# Patient Record
Sex: Male | Born: 1975
Health system: Southern US, Community
[De-identification: ages and names within clinical notes are randomized; demographics above are authoritative.]

## PROBLEM LIST (undated history)

## (undated) DIAGNOSIS — I2510 Atherosclerotic heart disease of native coronary artery without angina pectoris: Secondary | ICD-10-CM

## (undated) DIAGNOSIS — I10 Essential (primary) hypertension: Secondary | ICD-10-CM

## (undated) DIAGNOSIS — F419 Anxiety disorder, unspecified: Secondary | ICD-10-CM

## (undated) HISTORY — DX: Anxiety disorder, unspecified: F41.9

## (undated) HISTORY — PX: NO PAST SURGERIES: SHX2092

---

## 2016-11-15 ENCOUNTER — Encounter (HOSPITAL_COMMUNITY): Payer: Self-pay

## 2016-11-15 ENCOUNTER — Emergency Department (HOSPITAL_COMMUNITY): Payer: Self-pay

## 2016-11-15 ENCOUNTER — Emergency Department (HOSPITAL_COMMUNITY)
Admission: EM | Admit: 2016-11-15 | Discharge: 2016-11-15 | Disposition: A | Discharge status: Home / Self Care | Payer: Self-pay | Attending: Emergency Medicine | Admitting: Emergency Medicine

## 2016-11-15 DIAGNOSIS — R079 Chest pain, unspecified: Secondary | ICD-10-CM

## 2016-11-15 DIAGNOSIS — I159 Secondary hypertension, unspecified: Secondary | ICD-10-CM | POA: Insufficient documentation

## 2016-11-15 DIAGNOSIS — F17200 Nicotine dependence, unspecified, uncomplicated: Secondary | ICD-10-CM | POA: Insufficient documentation

## 2016-11-15 DIAGNOSIS — Z720 Tobacco use: Secondary | ICD-10-CM

## 2016-11-15 HISTORY — DX: Essential (primary) hypertension: I10

## 2016-11-15 LAB — CBC
HEMATOCRIT: 44.9 % (ref 39.0–52.0)
HEMOGLOBIN: 15.5 g/dL (ref 13.0–17.0)
MCH: 32.5 pg (ref 26.0–34.0)
MCHC: 34.5 g/dL (ref 30.0–36.0)
MCV: 94.1 fL (ref 78.0–100.0)
Platelets: 199 10*3/uL (ref 150–400)
RBC: 4.77 MIL/uL (ref 4.22–5.81)
RDW: 13.5 % (ref 11.5–15.5)
WBC: 4.2 10*3/uL (ref 4.0–10.5)

## 2016-11-15 LAB — BASIC METABOLIC PANEL
ANION GAP: 14 (ref 5–15)
BUN: 13 mg/dL (ref 6–20)
CO2: 27 mmol/L (ref 22–32)
Calcium: 9.4 mg/dL (ref 8.9–10.3)
Chloride: 95 mmol/L — ABNORMAL LOW (ref 101–111)
Creatinine, Ser: 0.87 mg/dL (ref 0.61–1.24)
GFR calc Af Amer: >60 mL/min (ref >60)
GLUCOSE: 112 mg/dL — AB (ref 65–99)
POTASSIUM: 3.6 mmol/L (ref 3.5–5.1)
Sodium: 136 mmol/L (ref 135–145)

## 2016-11-15 LAB — I-STAT TROPONIN, ED: Troponin i, poc: 0 ng/mL (ref 0.00–0.08)

## 2016-11-15 MED ORDER — LISINOPRIL 20 MG PO TABS: 20.0000 mg | ORAL_TABLET | Freq: Every day | ORAL | 1 refills | Status: DC

## 2016-11-15 MED FILL — LISINOPRIL 20 MG TABLET: 20 | 30 days supply | Qty: 30 | Fill #0

## 2016-11-15 NOTE — ED Triage Notes (Signed)
Pt reports intermittent, non-radiating episodes of central chest pain described as "pressure" that started yesterday associated with hot flashes. He denies any other symptoms.

## 2016-11-15 NOTE — ED Provider Notes (Signed)
[facility] DEPT Provider Note   CSN: [id number] Arrival date & time: 11/15/16  0746     History   Chief Complaint Chief Complaint  Patient presents with  . Chest Pain    HPI Paul Molina is a 41 y.o. male.  The patient complains of chest discomfort, which started last night after he went to sleep and waxed and waned for several hours.  The pain is a "tight feeling".  He has had this several times before, only at night.  He denies exertional dyspnea, fever, chills, nausea, vomiting, weakness or dizziness.  He has previously been diagnosed with high blood pressure, and hyper cholesterolemia, but stopped taking medications, and stop seeing a doctor.  He works as a Counsellor.  He smokes cigarettes.  There are no other known modifying factors.  HPI  Past Medical History:  Diagnosis Date  . Hypertension     There are no active problems to display for this patient.   History reviewed. No pertinent surgical history.     Home Medications    Prior to Admission medications   Medication Sig Start Date End Date Taking? Authorizing Provider  lisinopril (PRINIVIL,ZESTRIL) 20 MG tablet Take 1 tablet (20 mg total) by mouth daily. 11/15/16   Daleen Bo, MD    Family History No family history on file.  Social History Social History  Substance Use Topics  . Smoking status: Current Every Day Smoker  . Smokeless tobacco: Never Used  . Alcohol use No     Allergies   Patient has no allergy information on record.   Review of Systems Review of Systems  All other systems reviewed and are negative.    Physical Exam Updated Vital Signs BP (!) 187/114 (BP Location: Left Arm)   Pulse 81   Temp 98.1 F (36.7 C) (Oral)   Resp 18   SpO2 97%   Physical Exam  Constitutional: He is oriented to person, place, and time. He appears well-developed and well-nourished. No distress.  HENT:  Head: Normocephalic and atraumatic.  Right Ear: External ear  normal.  Left Ear: External ear normal.  Eyes: Conjunctivae and EOM are normal. Pupils are equal, round, and reactive to light.  Neck: Normal range of motion and phonation normal. Neck supple.  Cardiovascular: Normal rate, regular rhythm and normal heart sounds.   Pulmonary/Chest: Effort normal and breath sounds normal. He exhibits no bony tenderness.  Abdominal: Soft. There is no tenderness.  Musculoskeletal: Normal range of motion.  Neurological: He is alert and oriented to person, place, and time. No cranial nerve deficit or sensory deficit. He exhibits normal muscle tone. Coordination normal.  No dysarthria or aphasia  Skin: Skin is warm, dry and intact.  Psychiatric: He has a normal mood and affect. His behavior is normal. Judgment and thought content normal.  Nursing note and vitals reviewed.    ED Treatments / Results  Labs (all labs ordered are listed, but only abnormal results are displayed) Labs Reviewed  BASIC METABOLIC PANEL - Abnormal; Notable for the following:       Result Value   Chloride 95 (*)    Glucose, Bld 112 (*)    All other components within normal limits  CBC  I-STAT TROPOININ, ED    EKG  EKG Interpretation  Date/Time:  Wednesday November 15 2016 07:55:05 EDT Ventricular Rate:  77 PR Interval:  132 QRS Duration: 90 QT Interval:  382 QTC Calculation: 432 R Axis:   90 Text Interpretation:  Normal  sinus rhythm Rightward axis Left ventricular hypertrophy Abnormal ECG No old tracing to compare Confirmed by [facility]  MD, Hager Compston [phone]) on 11/15/2016 9:23:38 AM       Radiology Dg Chest 2 View  Result Date: 11/15/2016 CLINICAL DATA:  Increasing chest pain and tightness since last night with shortness of breath. No known cardiopulmonary abnormalities. Current smoker. [place] lumen CV EXAM: CHEST  2 VIEW COMPARISON:  None in PACs FINDINGS: The lungs are well-expanded and clear. The heart and pulmonary vascularity are normal. The mediastinum is  normal in width. There is no pleural effusion. The bony thorax is unremarkable. IMPRESSION: There is no active cardiopulmonary disease. Electronically Signed   By: David  Martinique M.D.   On: 11/15/2016 08:24    Procedures Procedures (including critical care time)  Medications Ordered in ED Medications - No data to display   Initial Impression / Assessment and Plan / ED Course  I have reviewed the triage vital signs and the nursing notes.  Pertinent labs & imaging results that were available during my care of the patient were reviewed by me and considered in my medical decision making (see chart for details).     Medications - No data to display  Patient Vitals for the past 24 hrs:  BP Temp Temp src Pulse Resp SpO2  11/15/16 0751 (!) 187/114 98.1 F (36.7 C) Oral 81 18 97 %    9:50 AM Reevaluation with update and discussion. After initial assessment and treatment, an updated evaluation reveals no change in clinical status.  Findings discussed with patient and all questions answered.Daleen Bo L    Final Clinical Impressions(s) / ED Diagnoses   Final diagnoses:  Nonspecific chest pain  Secondary hypertension  Tobacco abuse   Nonspecific nocturnal chest pain, with reassuring evaluation.  Doubt ACS PE or pneumonia.  Likely esophageal reflux, gastritis, disease.  Untreated hypertension and elevated cholesterol.  This is the patient's choice.  Also tobacco abuse is present.  Nursing Notes Reviewed/ Care Coordinated Applicable Imaging Reviewed Interpretation of Laboratory Data incorporated into ED treatment  The patient appears reasonably screened and/or stabilized for discharge and I doubt any other medical condition or other [facility] requiring further screening, evaluation, or treatment in the ED at this time prior to discharge.  Plan: Home Medications-OTC antacid, gastric acid blocker or PPI of choice; Home Treatments-avoid tobacco; return here if the recommended treatment, does  not improve the symptoms; Recommended follow up-PCP of choice ASAP, for ongoing management and prescriptions   New Prescriptions New Prescriptions   LISINOPRIL (PRINIVIL,ZESTRIL) 20 MG TABLET    Take 1 tablet (20 mg total) by mouth daily.     Daleen Bo, MD 11/15/16 [phone]

## 2016-11-15 NOTE — Discharge Instructions (Signed)
Your symptoms are likely related to esophageal reflux.  To treat this there are 3 different modalities.  The easiest is an antacid such as Maalox, or Rolaids, before meals and at bedtime.  You could also take a drug called a gastric acid blocker, such as Zantac or Pepcid twice a day.  A third type of treatment is Prilosec once a day.  Whichever type you choose you should take it for at least 3 weeks.  Your blood pressure is very high and you need to start taking a blood pressure medication today.  This can help avoid problems such as stroke heart attack or kidney failure.  Your tobacco abuse, is also a problem because it is likely the source of your chest discomfort, as it typically tends to increase the amount of acid in our gastrointestinal system.  Try to stop smoking immediately.  It is important to follow-up with a primary care doctor for ongoing management of your symptoms, and prescriptions.  He will likely need to be on blood pressure medications for the rest of your life.  Use the phone number attached to this document, to help find a primary care doctor to see within the next 2 or 3 weeks.

## 2016-11-20 ENCOUNTER — Inpatient Hospital Stay: Payer: Self-pay

## 2017-05-01 ENCOUNTER — Encounter (HOSPITAL_COMMUNITY): Payer: Self-pay | Admitting: Emergency Medicine

## 2017-05-01 ENCOUNTER — Ambulatory Visit (HOSPITAL_COMMUNITY)
Admission: EM | Admit: 2017-05-01 | Discharge: 2017-05-01 | Disposition: A | Discharge status: Home / Self Care | Payer: Self-pay | Attending: Urgent Care | Admitting: Urgent Care

## 2017-05-01 DIAGNOSIS — I10 Essential (primary) hypertension: Secondary | ICD-10-CM

## 2017-05-01 DIAGNOSIS — R1030 Lower abdominal pain, unspecified: Secondary | ICD-10-CM

## 2017-05-01 DIAGNOSIS — R030 Elevated blood-pressure reading, without diagnosis of hypertension: Secondary | ICD-10-CM

## 2017-05-01 DIAGNOSIS — R42 Dizziness and giddiness: Secondary | ICD-10-CM

## 2017-05-01 DIAGNOSIS — I160 Hypertensive urgency: Secondary | ICD-10-CM

## 2017-05-01 DIAGNOSIS — R519 Headache, unspecified: Secondary | ICD-10-CM

## 2017-05-01 DIAGNOSIS — R51 Headache: Secondary | ICD-10-CM

## 2017-05-01 LAB — POCT I-STAT, CHEM 8
BUN: 10 mg/dL (ref 6–20)
CREATININE: 0.9 mg/dL (ref 0.61–1.24)
Calcium, Ion: 1.09 mmol/L — ABNORMAL LOW (ref 1.15–1.40)
Chloride: 101 mmol/L (ref 101–111)
Glucose, Bld: 99 mg/dL (ref 65–99)
HEMATOCRIT: 49 % (ref 39.0–52.0)
Hemoglobin: 16.7 g/dL (ref 13.0–17.0)
Potassium: 4.4 mmol/L (ref 3.5–5.1)
Sodium: 139 mmol/L (ref 135–145)
TCO2: 28 mmol/L (ref 22–32)

## 2017-05-01 LAB — POCT URINALYSIS DIP (DEVICE)
Bilirubin Urine: NEGATIVE
Glucose, UA: NEGATIVE mg/dL
Hgb urine dipstick: NEGATIVE
KETONES UR: NEGATIVE mg/dL
Leukocytes, UA: NEGATIVE
Nitrite: NEGATIVE
PH: 6.5 (ref 5.0–8.0)
PROTEIN: NEGATIVE mg/dL
Specific Gravity, Urine: 1.025 (ref 1.005–1.030)
Urobilinogen, UA: 0.2 mg/dL (ref 0.0–1.0)

## 2017-05-01 MED ORDER — AMLODIPINE BESYLATE 5 MG PO TABS: 5.0000 mg | ORAL_TABLET | Freq: Every day | ORAL | 0 refills | Status: DC

## 2017-05-01 NOTE — ED Triage Notes (Addendum)
PT reports abdominal pain, sweating, nausea, dizziness, and headache that started Sunday night. No fever. PT is hypertensive and has not had his meds in over a month.

## 2017-05-01 NOTE — ED Provider Notes (Signed)
MRN: [id number] DOB: 1976-07-29  Subjective:   Paul Molina is a 41 y.o. male presenting for chief complaint of Dizziness and Abdominal Pain  Reports onset of headache, nausea without vomiting, intermittent dizziness, abdominal pain. Has not tried medications for relief. Denies confusion, weakness, numbness or tingling, chest pain, heart racing, palpitations, hematuria, lower leg swelling. Smokes 3 cigarettes. Hydrates very well. Eats processed, frozen meals.   Paul Molina is not currently taking any medications and has No Known Allergies.  Paul Molina  has a past medical history of Hypertension. Denies past surgical history. Father has a history of diabetes.   Objective:   Vitals: BP (!) 192/110   Pulse 72   Temp 98.6 F (37 C) (Oral)   Resp 16   Ht 6' 1"  (1.854 m)   Wt 171 lb (77.6 kg)   SpO2 100%   BMI 22.56 kg/m   BP Readings from Last 3 Encounters:  05/01/17 (!) 192/110  11/15/16 (!) 173/118   Physical Exam  Constitutional: He is oriented to person, place, and time. He appears well-developed and well-nourished.  HENT:  Mouth/Throat: Oropharynx is clear and moist.  Eyes: Pupils are equal, round, and reactive to light. EOM are normal. No scleral icterus.  Neck: Normal range of motion. Neck supple.  Cardiovascular: Normal rate, regular rhythm and intact distal pulses.  Exam reveals no gallop and no friction rub.   No murmur heard. Pulmonary/Chest: No respiratory distress. He has no wheezes. He has no rales.  Abdominal: Soft. Bowel sounds are normal. He exhibits no distension and no mass. There is no tenderness. There is no guarding.  Musculoskeletal: He exhibits no edema.  Neurological: He is alert and oriented to person, place, and time. He displays normal reflexes. No cranial nerve deficit.  Skin: Skin is warm and dry.  Psychiatric: He has a normal mood and affect.   ED ECG REPORT   Date: 05/01/2017  Rate: 68  Rhythm: normal sinus rhythm  QRS Axis: normal  Intervals:  normal  ST/T Wave abnormalities: normal  Conduction Disutrbances:none  Narrative Interpretation: Ecg from today actually looks better than the previous one. No acute processes.  Old EKG Reviewed: 11/15/2016  I have personally reviewed the EKG tracing and agree with the computerized printout as noted.   Results for orders placed or performed during the hospital encounter of 05/01/17 (from the past 24 hour(s))  POCT urinalysis dip (device)     Status: None   Collection Time: 05/01/17  2:09 PM  Result Value Ref Range   Glucose, UA NEGATIVE NEGATIVE mg/dL   Bilirubin Urine NEGATIVE NEGATIVE   Ketones, ur NEGATIVE NEGATIVE mg/dL   Specific Gravity, Urine 1.025 1.005 - 1.030   Hgb urine dipstick NEGATIVE NEGATIVE   pH 6.5 5.0 - 8.0   Protein, ur NEGATIVE NEGATIVE mg/dL   Urobilinogen, UA 0.2 0.0 - 1.0 mg/dL   Nitrite NEGATIVE NEGATIVE   Leukocytes, UA NEGATIVE NEGATIVE  I-STAT, chem 8     Status: Abnormal   Collection Time: 05/01/17  2:27 PM  Result Value Ref Range   Sodium 139 135 - 145 mmol/L   Potassium 4.4 3.5 - 5.1 mmol/L   Chloride 101 101 - 111 mmol/L   BUN 10 6 - 20 mg/dL   Creatinine, Ser 0.90 0.61 - 1.24 mg/dL   Glucose, Bld 99 65 - 99 mg/dL   Calcium, Ion 1.09 (L) 1.15 - 1.40 mmol/L   TCO2 28 22 - 32 mmol/L   Hemoglobin 16.7 13.0 - 17.0 g/dL  HCT 49.0 39.0 - 52.0 %    Assessment and Plan :   Hypertensive urgency  Dizziness  Lower abdominal pain  Acute nonintractable headache, unspecified headache type  Essential hypertension  Elevated blood pressure reading  Will start patient on amlodipine 9m and explained that his BP cannot be lowered to low to quickly. Counseled on lifestyle modifications and need for PCP. Return-to-clinic and ER precautions discussed, patient verbalized understanding. Otherwise, follow up in 1 week.  MJaynee Eagles PA-C [facility]  05/01/2017  1:48 PM    MJaynee Eagles PA-C 05/01/17 1520

## 2017-09-05 ENCOUNTER — Encounter (HOSPITAL_COMMUNITY): Payer: Self-pay | Admitting: Emergency Medicine

## 2017-09-05 ENCOUNTER — Ambulatory Visit (HOSPITAL_COMMUNITY)
Admission: EM | Admit: 2017-09-05 | Discharge: 2017-09-05 | Disposition: A | Discharge status: Home / Self Care | Payer: 59 | Attending: Family Medicine | Admitting: Family Medicine

## 2017-09-05 DIAGNOSIS — M79602 Pain in left arm: Secondary | ICD-10-CM | POA: Diagnosis not present

## 2017-09-05 DIAGNOSIS — M792 Neuralgia and neuritis, unspecified: Secondary | ICD-10-CM

## 2017-09-05 DIAGNOSIS — I10 Essential (primary) hypertension: Secondary | ICD-10-CM | POA: Diagnosis not present

## 2017-09-05 MED ORDER — PREDNISONE 10 MG (21) PO TBPK: ORAL_TABLET | Freq: Every day | ORAL | 0 refills | Status: DC

## 2017-09-05 MED ORDER — AMLODIPINE BESYLATE 5 MG PO TABS: 5.0000 mg | ORAL_TABLET | Freq: Every day | ORAL | 2 refills | Status: DC

## 2017-09-05 NOTE — ED Triage Notes (Signed)
PT C/O: back pain onset 2 weeks associated w/left arm pain   Pain increases w/activity   BP today is 151/123 -- has not had BP meds x1 month   Works on a computer all day  DENIES: inj/trauma   TAKING MEDS: Ibuprofen   A&O x4... NAD... Ambulatory

## 2017-09-10 NOTE — ED Provider Notes (Signed)
  [facility]   [id number] 09/05/17 Arrival Time: 9741  ASSESSMENT & PLAN:  1. Radicular pain of left upper extremity   2. Essential hypertension    Meds ordered this encounter  Medications  . predniSONE (STERAPRED UNI-PAK 21 TAB) 10 MG (21) TBPK tablet    Sig: Take by mouth daily. Take as directed.    Dispense:  21 tablet    Refill:  0  . amLODipine (NORVASC) 5 MG tablet    Sig: Take 1 tablet (5 mg total) by mouth daily.    Dispense:  30 tablet    Refill:  2   Encouraged him to establish care with a PCP. Trial of prednisone for LUE radicular pain.  Recommend recheck BP within one week. May return here.  Reviewed expectations re: course of current medical issues. Questions answered. Outlined signs and symptoms indicating need for more acute intervention. Patient verbalized understanding. After Visit Summary given.  SUBJECTIVE: History from: patient. Paul Molina is a 42 y.o. male who reports:  Reports pain of his left upper extremity along with occas L neck soreness; intermittent; described as aching and shooting with radiation down his L arm Onset: gradual, 2 weeks ago Injury/trama: no Severity: mild to moderate when present Progression: stable Relieved by: nothing in particular Worsened by: certain movements, occasionally Associated symptoms: none reported Extremity sensation changes or weakness: none Self treatment: ibuprofen with mild help  History of similar: no  Also reports that he has been out of his BP medication for one month. Requests refill. Reports HTN previously controlled with medication.  ROS: As per HPI.   OBJECTIVE:  Vitals:   09/05/17 1328  BP: (!) 151/123  Pulse: 83  Resp: 16  Temp: 98.4 F (36.9 C)  TempSrc: Oral  SpO2: 100%    General appearance: alert; no distress Neck: mild tenderness over L neck muscles; no midline tenderness; FROM Extremities: no cyanosis or edema; symmetrical with no gross deformities; poorly  localized mild tenderness of his L shoulder and upper L arm with no swelling and no bruising; ROM: normal CV: normal extremity capillary refill Skin: warm and dry Neurologic: normal gait; normal symmetric reflexes in all extremities; normal sensation in all extremities Psychological: alert and cooperative; normal mood and affect  No Known Allergies  Past Medical History:  Diagnosis Date  . Hypertension    Social History   Socioeconomic History  . Marital status: Single    Spouse name: Not on file  . Number of children: Not on file  . Years of education: Not on file  . Highest education level: Not on file  Social Needs  . Financial resource strain: Not on file  . Food insecurity - worry: Not on file  . Food insecurity - inability: Not on file  . Transportation needs - medical: Not on file  . Transportation needs - non-medical: Not on file  Occupational History  . Not on file  Tobacco Use  . Smoking status: Current Every Day Smoker    Packs/day: 0.20    Types: Cigarettes  . Smokeless tobacco: Never Used  Substance and Sexual Activity  . Alcohol use: Yes  . Drug use: Not on file  . Sexual activity: Not on file  Other Topics Concern  . Not on file  Social History Narrative  . Not on file        Vanessa Kick, MD 09/10/17 1401

## 2018-09-14 IMAGING — CR DG CHEST 2V
2 series · 2 of 2 positions shown · non-contrast
Comparison: None in PACs

CLINICAL DATA: Increasing chest pain and tightness since last night
with shortness of breath. No known cardiopulmonary abnormalities.
Current smoker. [place] lumen CV

EXAM:
CHEST  2 VIEW

[chest pa]
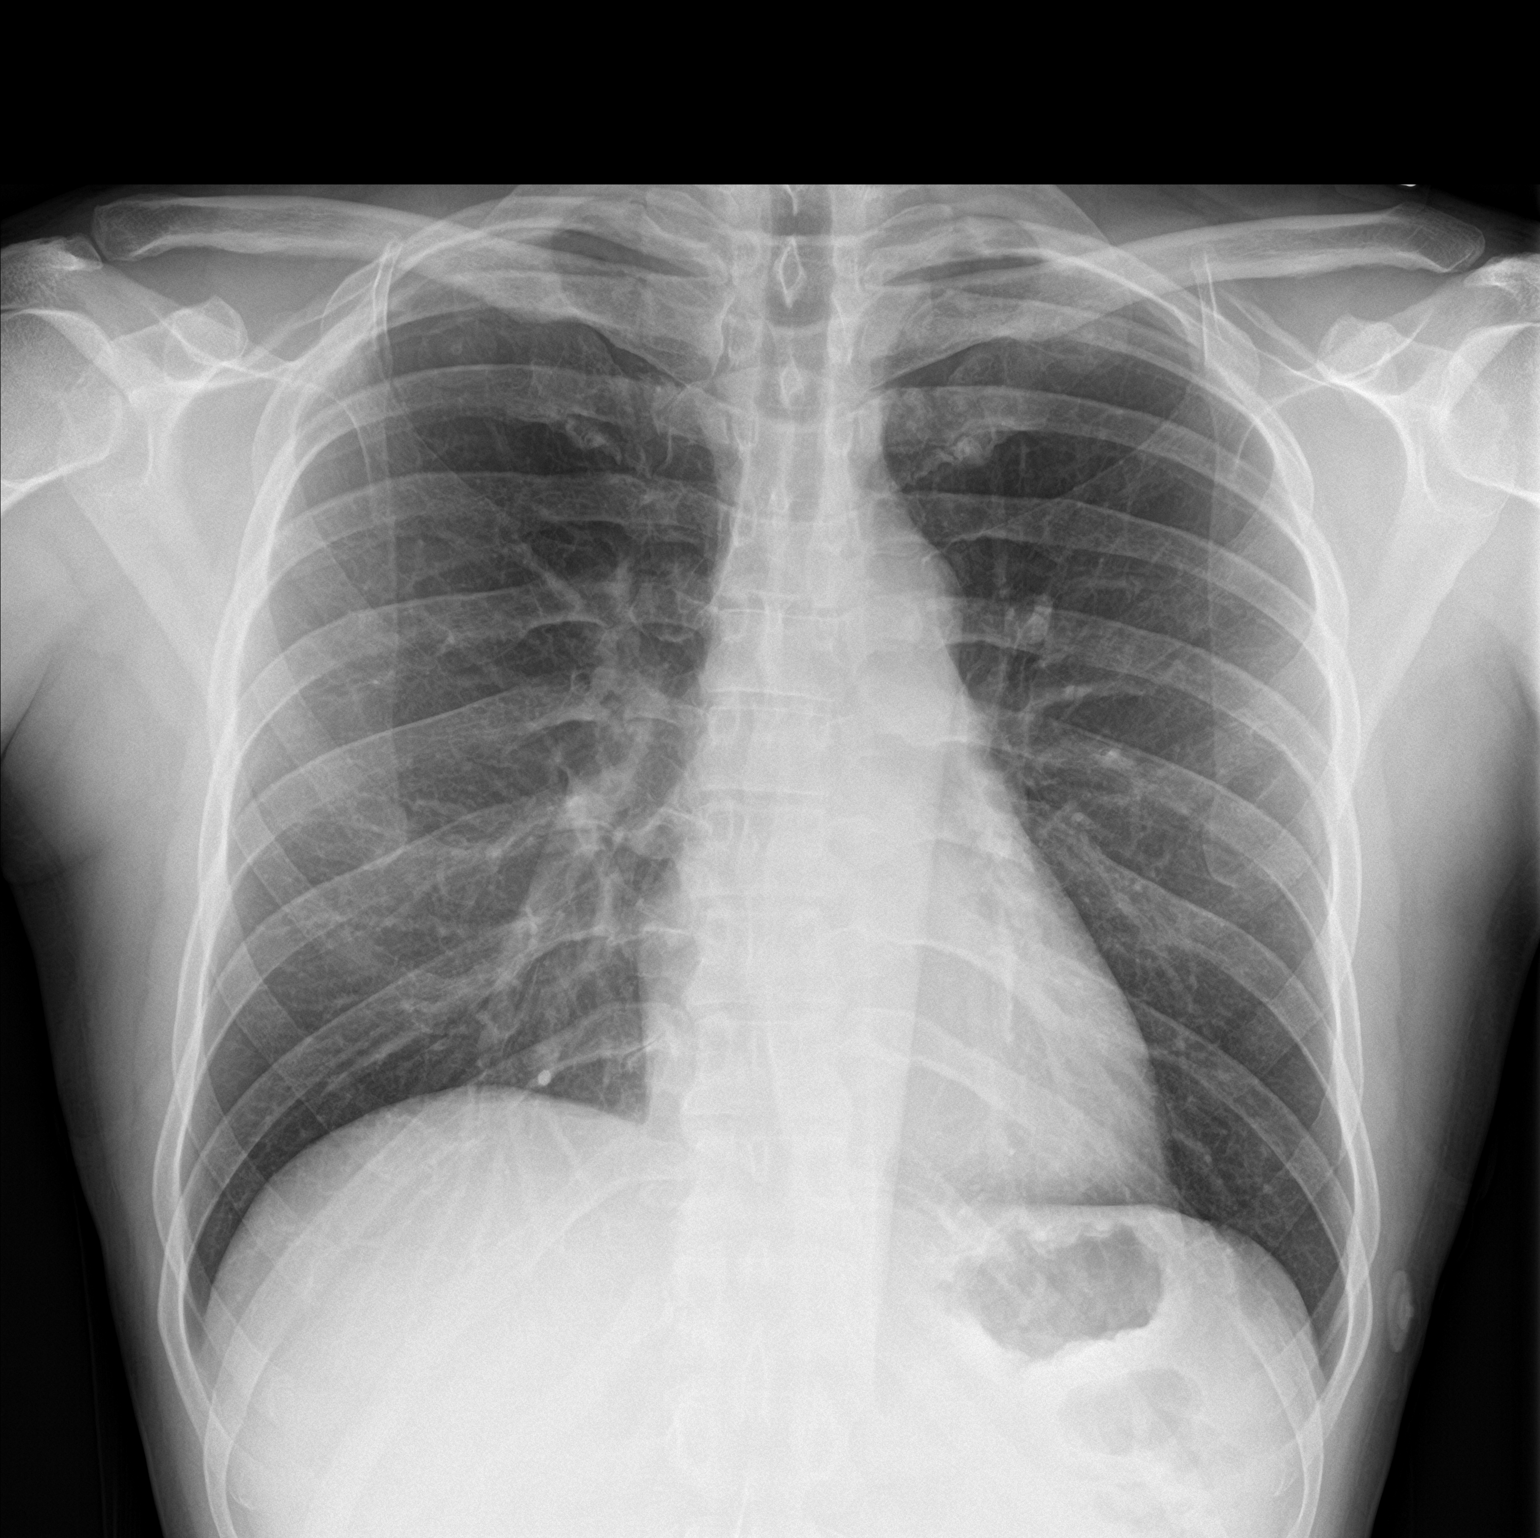

[chest lat]
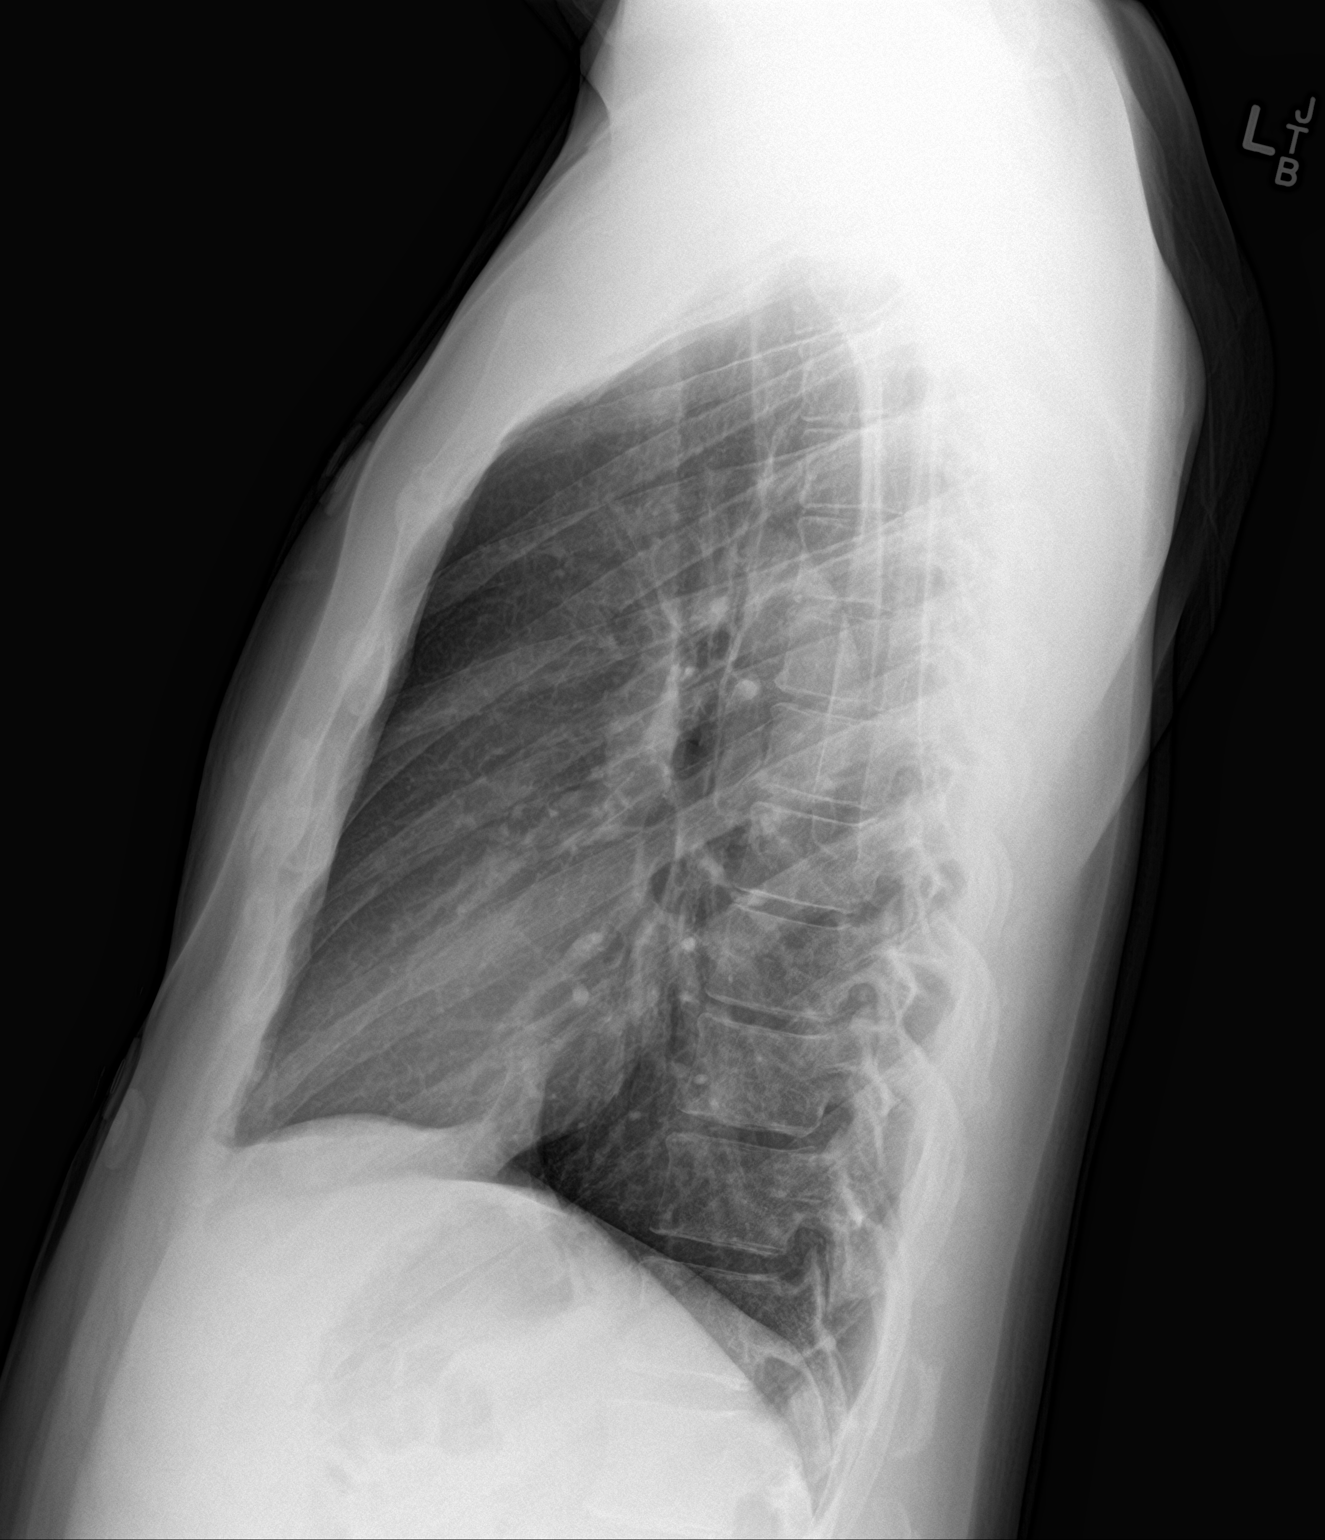

[2 of 2 positions shown; findings below may reference images not displayed]

FINDINGS: The lungs are well-expanded and clear. The heart and pulmonary
vascularity are normal. The mediastinum is normal in width. There is
no pleural effusion. The bony thorax is unremarkable.
IMPRESSION: There is no active cardiopulmonary disease.

## 2019-10-01 ENCOUNTER — Emergency Department (HOSPITAL_COMMUNITY): Payer: Self-pay

## 2019-10-01 ENCOUNTER — Observation Stay (HOSPITAL_COMMUNITY)
Admission: EM | Admit: 2019-10-01 | Discharge: 2019-10-02 | Disposition: A | Discharge status: Home / Self Care | Payer: Self-pay | Attending: Student | Admitting: Student

## 2019-10-01 ENCOUNTER — Other Ambulatory Visit: Payer: Self-pay

## 2019-10-01 ENCOUNTER — Encounter (HOSPITAL_COMMUNITY): Payer: Self-pay | Admitting: Emergency Medicine

## 2019-10-01 DIAGNOSIS — I160 Hypertensive urgency: Secondary | ICD-10-CM

## 2019-10-01 DIAGNOSIS — Z8249 Family history of ischemic heart disease and other diseases of the circulatory system: Secondary | ICD-10-CM | POA: Insufficient documentation

## 2019-10-01 DIAGNOSIS — Z79899 Other long term (current) drug therapy: Secondary | ICD-10-CM | POA: Insufficient documentation

## 2019-10-01 DIAGNOSIS — J984 Other disorders of lung: Secondary | ICD-10-CM | POA: Insufficient documentation

## 2019-10-01 DIAGNOSIS — Z823 Family history of stroke: Secondary | ICD-10-CM | POA: Insufficient documentation

## 2019-10-01 DIAGNOSIS — R0789 Other chest pain: Secondary | ICD-10-CM | POA: Insufficient documentation

## 2019-10-01 DIAGNOSIS — Z9114 Patient's other noncompliance with medication regimen: Secondary | ICD-10-CM | POA: Insufficient documentation

## 2019-10-01 DIAGNOSIS — R931 Abnormal findings on diagnostic imaging of heart and coronary circulation: Secondary | ICD-10-CM | POA: Insufficient documentation

## 2019-10-01 DIAGNOSIS — Z87891 Personal history of nicotine dependence: Secondary | ICD-10-CM | POA: Insufficient documentation

## 2019-10-01 DIAGNOSIS — Z7952 Long term (current) use of systemic steroids: Secondary | ICD-10-CM | POA: Insufficient documentation

## 2019-10-01 DIAGNOSIS — Z720 Tobacco use: Secondary | ICD-10-CM

## 2019-10-01 DIAGNOSIS — I10 Essential (primary) hypertension: Secondary | ICD-10-CM | POA: Insufficient documentation

## 2019-10-01 DIAGNOSIS — R079 Chest pain, unspecified: Secondary | ICD-10-CM

## 2019-10-01 DIAGNOSIS — Z20822 Contact with and (suspected) exposure to covid-19: Secondary | ICD-10-CM | POA: Insufficient documentation

## 2019-10-01 HISTORY — DX: Hypertensive urgency: I16.0

## 2019-10-01 LAB — CBC
HCT: 44.5 % (ref 39.0–52.0)
Hemoglobin: 15.4 g/dL (ref 13.0–17.0)
MCH: 32.8 pg (ref 26.0–34.0)
MCHC: 34.6 g/dL (ref 30.0–36.0)
MCV: 94.7 fL (ref 80.0–100.0)
Platelets: 226 10*3/uL (ref 150–400)
RBC: 4.7 MIL/uL (ref 4.22–5.81)
RDW: 12.6 % (ref 11.5–15.5)
WBC: 3.8 10*3/uL — ABNORMAL LOW (ref 4.0–10.5)
nRBC: 0 % (ref 0.0–0.2)

## 2019-10-01 LAB — BASIC METABOLIC PANEL
Anion gap: 11 (ref 5–15)
BUN: 7 mg/dL (ref 6–20)
CO2: 27 mmol/L (ref 22–32)
Calcium: 9 mg/dL (ref 8.9–10.3)
Chloride: 97 mmol/L — ABNORMAL LOW (ref 98–111)
Creatinine, Ser: 0.9 mg/dL (ref 0.61–1.24)
GFR calc Af Amer: >60 mL/min (ref >60)
GFR calc non Af Amer: >60 mL/min (ref >60)
Glucose, Bld: 115 mg/dL — ABNORMAL HIGH (ref 70–99)
Potassium: 3.7 mmol/L (ref 3.5–5.1)
Sodium: 135 mmol/L (ref 135–145)

## 2019-10-01 LAB — TROPONIN I (HIGH SENSITIVITY)
Troponin I (High Sensitivity): 6 ng/L (ref <18)
Troponin I (High Sensitivity): 5 ng/L (ref <18)

## 2019-10-01 LAB — POC SARS CORONAVIRUS 2 AG -  ED: SARS Coronavirus 2 Ag: NEGATIVE

## 2019-10-01 MED ORDER — HYDRALAZINE HCL 20 MG/ML IJ SOLN
10.0000 mg | Freq: Once | INTRAMUSCULAR | Status: AC
  Administered 2019-10-01: 23:00:00 10 mg via INTRAVENOUS
  Filled 2019-10-01: qty 1

## 2019-10-01 MED ORDER — SODIUM CHLORIDE 0.9% FLUSH: 3.0000 mL | Freq: Once | INTRAVENOUS | Status: DC

## 2019-10-01 MED ORDER — ONDANSETRON HCL 4 MG/2ML IJ SOLN
4.0000 mg | Freq: Once | INTRAMUSCULAR | Status: AC
  Administered 2019-10-01: 4 mg via INTRAVENOUS
  Filled 2019-10-01: qty 2

## 2019-10-01 MED ORDER — MORPHINE SULFATE (PF) 4 MG/ML IV SOLN
4.0000 mg | Freq: Once | INTRAVENOUS | Status: AC
  Administered 2019-10-01: 22:00:00 4 mg via INTRAVENOUS
  Filled 2019-10-01: qty 1

## 2019-10-01 MED ORDER — LABETALOL HCL 5 MG/ML IV SOLN
10.0000 mg | Freq: Once | INTRAVENOUS | Status: AC
  Administered 2019-10-01: 22:00:00 10 mg via INTRAVENOUS
  Filled 2019-10-01: qty 4

## 2019-10-01 NOTE — ED Notes (Signed)
Reported to [place]

## 2019-10-01 NOTE — ED Provider Notes (Signed)
[facility] EMERGENCY DEPARTMENT Provider Note   CSN: [id number] Arrival date & time: 10/01/19  1504     History Chief Complaint  Patient presents with  . Chest Pain  . Dizziness  . Hypertension    Paul Molina is a 44 y.o. male has no history of chest pain, dizziness, hypertension who presents for evaluation of 2 weeks of intermittent chest pain, dizziness.  He states that for the last 2 weeks, he has had intermittent chest pain.  He reports that since yesterday, the chest pain has been constant.  He describes it as "feeling like somebody dropped a pressure plate on his chest."  He does report that it is slightly worse with deep inspiration and worse with exertion.  He has had episodes where he has had diaphoresis, nausea/vomiting associated with the chest pain.  He did have some vomiting that occurred yesterday.  No blood noted in the vomit.  He had an episode of emesis today but since then has not had any more emesis.  He denies any shortness of breath.  He describes the dizziness sensation as a "lightheadedness type sensation."  He attributes this to his blood pressure as he states it has been high over the last several weeks.  He is supposed to be on lisinopril but he states that it does not work and he still has very high blood pressure even on the medication.  He has not taken his lisinopril in about a week.  He states he came in today because he was concerned that the chest pain was lasting for so long.  He has not taken any medications.  He reports that he smoked cigarettes but he states he quit about 2 weeks ago.  He denies any cocaine use.  He has no history of diabetes.  No personal cardiac history.  No family history of MI before the age of 68.  He denies any fevers, cough, abdominal pain.  He does report that several coworkers have tested positive for Covid but he reports limited exposure.  The history is provided by the patient.    HPI: A 44 year old patient  with a history of hypertension presents for evaluation of chest pain. Initial onset of pain was more than 6 hours ago. The patient's chest pain is described as heaviness/pressure/tightness and is worse with exertion. The patient complains of nausea and reports some diaphoresis. The patient's chest pain is not middle- or left-sided, is not well-localized, is not sharp and does not radiate to the arms/jaw/neck. The patient has smoked in the past 90 days. The patient has no history of stroke, has no history of peripheral artery disease, denies any history of treated diabetes, has no relevant family history of coronary artery disease (first degree relative at less than age 47), has no history of hypercholesterolemia and does not have an elevated BMI (>=30).   Past Medical History:  Diagnosis Date  . Hypertension     Patient Active Problem List   Diagnosis Date Noted  . Hypertensive urgency 10/01/2019    History reviewed. No pertinent surgical history.     History reviewed. No pertinent family history.  Social History   Tobacco Use  . Smoking status: Current Every Day Smoker    Packs/day: 0.20    Types: Cigarettes  . Smokeless tobacco: Never Used  Substance Use Topics  . Alcohol use: Yes  . Drug use: Not on file    Home Medications Prior to Admission medications   Medication Sig Start  Date End Date Taking? Authorizing Provider  amLODipine (NORVASC) 5 MG tablet Take 1 tablet (5 mg total) by mouth daily. 09/05/17   Vanessa Kick, MD  predniSONE (STERAPRED UNI-PAK 21 TAB) 10 MG (21) TBPK tablet Take by mouth daily. Take as directed. 09/05/17   Vanessa Kick, MD    Allergies    Patient has no known allergies.  Review of Systems   Review of Systems  Constitutional: Negative for fever.  Respiratory: Negative for cough and shortness of breath.   Cardiovascular: Positive for chest pain.  Gastrointestinal: Negative for abdominal pain, nausea and vomiting.  Genitourinary: Negative for  dysuria and hematuria.  Neurological: Positive for dizziness and headaches.  All other systems reviewed and are negative.   Physical Exam Updated Vital Signs BP (!) 173/106   Pulse 78   Temp 98.4 F (36.9 C) (Oral)   Resp 19   Ht 6' 1"  (1.854 m)   Wt 82.1 kg   SpO2 98%   BMI 23.88 kg/m   Physical Exam Vitals and nursing note reviewed.  Constitutional:      Appearance: Normal appearance. He is well-developed.  HENT:     Head: Normocephalic and atraumatic.  Eyes:     General: Lids are normal.     Conjunctiva/sclera: Conjunctivae normal.     Pupils: Pupils are equal, round, and reactive to light.  Cardiovascular:     Rate and Rhythm: Normal rate and regular rhythm.     Pulses: Normal pulses.          Radial pulses are 2+ on the right side and 2+ on the left side.     Heart sounds: Normal heart sounds. No murmur. No friction rub. No gallop.   Pulmonary:     Effort: Pulmonary effort is normal.     Breath sounds: Normal breath sounds.     Comments: Lungs clear to auscultation bilaterally.  Symmetric chest rise.  No wheezing, rales, rhonchi. Abdominal:     Palpations: Abdomen is soft. Abdomen is not rigid.     Tenderness: There is no abdominal tenderness. There is no guarding.     Comments: Abdomen is soft, non-distended, non-tender. No rigidity, No guarding. No peritoneal signs.  Musculoskeletal:        General: Normal range of motion.     Cervical back: Full passive range of motion without pain.  Skin:    General: Skin is warm and dry.     Capillary Refill: Capillary refill takes less than 2 seconds.  Neurological:     Mental Status: He is alert and oriented to person, place, and time.     Comments: Cranial nerves III-XII intact Follows commands, Moves all extremities  5/5 strength to BUE and BLE  Sensation intact throughout all major nerve distributions No gait abnormalities  No slurred speech. No facial droop.   Psychiatric:        Speech: Speech normal.      ED Results / Procedures / Treatments   Labs (all labs ordered are listed, but only abnormal results are displayed) Labs Reviewed  BASIC METABOLIC PANEL - Abnormal; Notable for the following components:      Result Value   Chloride 97 (*)    Glucose, Bld 115 (*)    All other components within normal limits  CBC - Abnormal; Notable for the following components:   WBC 3.8 (*)    All other components within normal limits  SARS CORONAVIRUS 2 (TAT 6-24 HRS)  POC SARS CORONAVIRUS 2  AG -  ED  TROPONIN I (HIGH SENSITIVITY)  TROPONIN I (HIGH SENSITIVITY)    EKG EKG Interpretation  Date/Time:  Wednesday October 01 2019 16:12:50 EST Ventricular Rate:  68 PR Interval:  136 QRS Duration: 86 QT Interval:  412 QTC Calculation: 438 R Axis:   88 Text Interpretation: Normal sinus rhythm Minimal voltage criteria for LVH, may be normal variant ( Sokolow-Lyon ) Borderline ECG Confirmed by Gerlene Fee [phone]) on 10/01/2019 10:20:53 PM   Radiology DG Chest 2 View  Result Date: 10/01/2019 CLINICAL DATA:  Chest pain EXAM: CHEST - 2 VIEW COMPARISON:  11/15/2016 FINDINGS: The lungs are clear without focal pneumonia, edema, pneumothorax or pleural effusion. Nodular density/densities projecting over the lungs are compatible with pads for telemetry leads. The cardiopericardial silhouette is within normal limits for size. The visualized bony structures of the thorax are intact. IMPRESSION: No active cardiopulmonary disease. Electronically Signed   By: Misty Stanley M.D.   On: 10/01/2019 16:54    Procedures Procedures (including critical care time)  Medications Ordered in ED Medications  sodium chloride flush (NS) 0.9 % injection 3 mL (has no administration in time range)  morphine 4 MG/ML injection 4 mg (4 mg Intravenous Given 10/01/19 2142)  ondansetron (ZOFRAN) injection 4 mg (4 mg Intravenous Given 10/01/19 2143)  labetalol (NORMODYNE) injection 10 mg (10 mg Intravenous Given 10/01/19 2143)   hydrALAZINE (APRESOLINE) injection 10 mg (10 mg Intravenous Given 10/01/19 2253)    ED Course  I have reviewed the triage vital signs and the nursing notes.  Pertinent labs & imaging results that were available during my care of the patient were reviewed by me and considered in my medical decision making (see chart for details).  Clinical Course as of Feb 25 0002  Wed Oct 01, 2019  2035 Troponin I (High Sensitivity): 6 [KP]  2326 RDW: 12.6 [LL]    Clinical Course User Index [KP] Place, Cyril Mourning, Student-PA [LL] Desma Mcgregor   MDM Rules/Calculators/A&P HEAR Score: 58                    44 year old male with past history of hypertension who presents for evaluation of dizziness, chest pain, high blood pressure that has been ongoing for 2 weeks but worsened last 24 hours.  He states he has had intermittent episodes of chest pain that he describes as feeling like a pressure plate is on his chest.  Associated with nausea, vomiting, diaphoresis.  No shortness of breath.  Felt like the chest pain became worse yesterday, prompting ED visit.  Also reports he has had high blood pressure.  He is supposed to be on lisinopril but states he has not been taking the medication in about a week.  He does report that he smokes.  He does not have any personal cardiac history.  No family cardiac history.  No history of diabetes.  On initially arrival, he is afebrile.  He appears uncomfortable but no acute distress.  Blood pressure with systolic blood pressure in the 180s.  No neuro deficits noted on exam.  Concern for ACS etiology versus unstable angina versus hypertensive urgency/emergency.  Plan to check labs.  CBC shows leukocytosis of 3.8.  BMP shows normal BUN and creatinine.  Troponin is 5.  History/risk factors, he has a heart score of 4.  Patient given labetalol and hydralazine and still hypertensive.  At this time, given continued chest pain, high blood pressure, moderate heart score, feel  that he needs admission.  Discussed patient with Dr. Marlowe Sax (hospitalist). She accepts patient for admission.   Portions of this note were generated with Lobby[employer]. Dictation errors may occur despite best attempts at proofreading.  Final Clinical Impression(s) / ED Diagnoses Final diagnoses:  Essential hypertension  Other chest pain    Rx / DC Orders ED Discharge Orders    None       Volanda Napoleon, PA-C 10/02/19 0002    Maudie Flakes, MD 10/07/19 [phone]

## 2019-10-01 NOTE — ED Triage Notes (Signed)
Pt arrives to ED from home with complaints substernal chest pressure x1 week and dizziness x2 days. Patient states his blood pressures at home have been over 217 systolic. No SOB, fevers, chills.

## 2019-10-02 ENCOUNTER — Observation Stay (HOSPITAL_BASED_OUTPATIENT_CLINIC_OR_DEPARTMENT_OTHER): Payer: Self-pay

## 2019-10-02 ENCOUNTER — Encounter (HOSPITAL_COMMUNITY): Payer: Self-pay | Admitting: Internal Medicine

## 2019-10-02 DIAGNOSIS — Z9114 Patient's other noncompliance with medication regimen: Secondary | ICD-10-CM

## 2019-10-02 DIAGNOSIS — Z720 Tobacco use: Secondary | ICD-10-CM

## 2019-10-02 DIAGNOSIS — R079 Chest pain, unspecified: Secondary | ICD-10-CM

## 2019-10-02 DIAGNOSIS — I160 Hypertensive urgency: Secondary | ICD-10-CM

## 2019-10-02 DIAGNOSIS — I517 Cardiomegaly: Secondary | ICD-10-CM

## 2019-10-02 LAB — ECHOCARDIOGRAM COMPLETE
Height: 73 in
Weight: 2718.4 oz

## 2019-10-02 LAB — HIV ANTIBODY (ROUTINE TESTING W REFLEX): HIV Screen 4th Generation wRfx: NONREACTIVE

## 2019-10-02 LAB — SARS CORONAVIRUS 2 (TAT 6-24 HRS): SARS Coronavirus 2: NEGATIVE

## 2019-10-02 MED ORDER — AMLODIPINE BESYLATE 5 MG PO TABS
5.0000 mg | ORAL_TABLET | Freq: Every day | ORAL | Status: DC
  Administered 2019-10-02: 5 mg via ORAL
  Filled 2019-10-02 (×2): qty 1

## 2019-10-02 MED ORDER — AMLODIPINE BESYLATE 10 MG PO TABS: 10.0000 mg | ORAL_TABLET | Freq: Every day | ORAL | 1 refills | Status: DC

## 2019-10-02 MED ORDER — HYDRALAZINE HCL 20 MG/ML IJ SOLN: 10.0000 mg | INTRAMUSCULAR | Status: DC | PRN

## 2019-10-02 MED ORDER — ACETAMINOPHEN 650 MG RE SUPP: 650.0000 mg | Freq: Four times a day (QID) | RECTAL | Status: DC | PRN

## 2019-10-02 MED ORDER — ENOXAPARIN SODIUM 40 MG/0.4ML ~~LOC~~ SOLN
40.0000 mg | SUBCUTANEOUS | Status: DC
  Administered 2019-10-02: 10:00:00 40 mg via SUBCUTANEOUS
  Filled 2019-10-02: qty 0.4

## 2019-10-02 MED ORDER — NICOTINE 7 MG/24HR TD PT24: 7.0000 mg | MEDICATED_PATCH | Freq: Every day | TRANSDERMAL | Status: DC

## 2019-10-02 MED ORDER — CHLORTHALIDONE 25 MG PO TABS
12.5000 mg | ORAL_TABLET | Freq: Every day | ORAL | Status: DC
  Administered 2019-10-02: 03:00:00 12.5 mg via ORAL
  Filled 2019-10-02: qty 0.5

## 2019-10-02 MED ORDER — LABETALOL HCL 5 MG/ML IV SOLN: 10.0000 mg | INTRAVENOUS | Status: DC | PRN

## 2019-10-02 MED ORDER — ACETAMINOPHEN 325 MG PO TABS: 650.0000 mg | ORAL_TABLET | Freq: Four times a day (QID) | ORAL | Status: DC | PRN

## 2019-10-02 MED FILL — AMLODIPINE BESYLATE 10 MG T: 10 | 30 days supply | Qty: 30 | Fill #0

## 2019-10-02 NOTE — Progress Notes (Signed)
Discharge education completed, patient verbalized understanding of education provided and has no additional questions. All education completed, personal belonging given. IV d/c.

## 2019-10-02 NOTE — Progress Notes (Addendum)
New Admission Alert. Patient received to room 606 from [facility] ED via stretcher. ED staff nurse in accompany. No family in accompany. Admission vital signs obtained and entered. Admission history completed with patient serving as historian. Oriented patient to room. Menu provided with instructions on how to call and place food order. Verbalized understanding. Opportunity for questions to be asked and concerns voice. No questions asked. No concerns voiced.   Medication Education: 02.25.21 @ [phone].Vital signs obtained and recorded. Explained to patient there are two as needed are ordered, however the systolic number (top number) need to be 160 or greater. Current systolic number is 903. Verbalized understanding

## 2019-10-02 NOTE — Care Management (Signed)
1006 10-02-19 Case Manager received consult for Primary Care Physician- Patient has Medicaid- Case Manager did make patient aware that he should have a designated PCP on his Medicaid Card. Patient will check once he gets home. Case Manager did schedule a hospital follow up appointment with the [facility]- information placed on the AVS. No further needs from Case Manager at this time.

## 2019-10-02 NOTE — H&P (Addendum)
History and Physical    Ferdinando Lodge RKY:[id number] DOB: 12/05/1975 DOA: 10/01/2019  PCP: Patient, No Pcp Per Patient coming from: Home  Chief Complaint: Chest pain, dizziness, high blood pressure  HPI: Paul Molina is a 44 y.o. male with medical history significant of hypertension presenting to the ED for evaluation of chest pain, dizziness, and high blood pressure.  Patient states he was previously on a very low-dose of lisinopril for high blood pressure but ran out of his medication 2 weeks ago.  He was also on amlodipine at some point in the past.  He currently does not have a primary care physician and the medication was prescribed to him by [facility].  While he took the medication his blood pressure continued to be high.  After running out of medication, his blood pressure has been even higher with systolic in the 315V.  For the past 1 week he has had intermittent nonexertional substernal pressure-like pain associated with diaphoresis and nausea.  He has vomited a few times when he had chest pain and his blood pressure was very high.  Yesterday the chest pain was worse and he checked his blood pressure which was significantly elevated with systolic in the 761Y and diastolic in the 073X.  States his chest pain has now improved after receiving medications in the ED.  Currently not having any headaches, dyspnea, abdominal pain, or other complaints.  ED Course: Blood pressure elevated with systolic above 106 and diastolic up to 269S.  Remainder of vital signs stable.  Renal function at baseline.  High-sensitivity troponin negative x2.  EKG showing LVH and no acute ischemic changes.  Rapid SARS-CoV-2 antigen test negative, PCR test pending.  Chest x-ray showing no active cardiopulmonary disease.  Patient received hydralazine, labetalol, morphine, and Zofran.  Review of Systems:  All systems reviewed and apart from history of presenting illness, are negative.  Past Medical  History:  Diagnosis Date  . Hypertension     History reviewed. No pertinent surgical history.   reports that he has been smoking cigarettes. He has been smoking about 0.20 packs per day. He has never used smokeless tobacco. He reports current alcohol use. No history on file for drug.  No Known Allergies  Family History  Problem Relation Age of Onset  . Hypertension Mother   . Hypertension Father   . Stroke Father     Prior to Admission medications   Medication Sig Start Date End Date Taking? Authorizing Provider  amLODipine (NORVASC) 5 MG tablet Take 1 tablet (5 mg total) by mouth daily. 09/05/17   Vanessa Kick, MD  predniSONE (STERAPRED UNI-PAK 21 TAB) 10 MG (21) TBPK tablet Take by mouth daily. Take as directed. 09/05/17   Vanessa Kick, MD    Physical Exam: Vitals:   10/01/19 2330 10/01/19 2345 10/02/19 0214 10/02/19 0237  BP: (!) 183/115 (!) 173/106 (!) 151/93 (!) (P) 161/106  Pulse: 83 78 85 (P) 82  Resp: 15 19 18  (P) 18  Temp:      TempSrc:      SpO2: 96% 98% 98% (P) 96%  Weight:    (P) 77.1 kg  Height:    (P) 6' 1"  (1.854 m)    Physical Exam  Constitutional: He is oriented to person, place, and time. He appears well-developed and well-nourished. No distress.  HENT:  Head: Normocephalic.  Eyes: Right eye exhibits no discharge. Left eye exhibits no discharge.  Cardiovascular: Normal rate, regular rhythm and intact distal pulses.  Pulmonary/Chest:  Effort normal and breath sounds normal. No respiratory distress. He has no wheezes. He has no rales.  Abdominal: Soft. Bowel sounds are normal. He exhibits no distension. There is no abdominal tenderness. There is no guarding.  Musculoskeletal:        General: No edema.     Cervical back: Neck supple.  Neurological: He is alert and oriented to person, place, and time.  Skin: Skin is warm and dry. He is not diaphoretic.     Labs on Admission: I have personally reviewed following labs and imaging studies  CBC: Recent  Labs  Lab 10/01/19 1610  WBC 3.8*  HGB 15.4  HCT 44.5  MCV 94.7  PLT 132   Basic Metabolic Panel: Recent Labs  Lab 10/01/19 1610  NA 135  K 3.7  CL 97*  CO2 27  GLUCOSE 115*  BUN 7  CREATININE 0.90  CALCIUM 9.0   GFR: Estimated Creatinine Clearance: 118.4 mL/min (by C-G formula based on SCr of 0.9 mg/dL). Liver Function Tests: No results for input(s): AST, ALT, ALKPHOS, BILITOT, PROT, ALBUMIN in the last 168 hours. No results for input(s): LIPASE, AMYLASE in the last 168 hours. No results for input(s): AMMONIA in the last 168 hours. Coagulation Profile: No results for input(s): INR, PROTIME in the last 168 hours. Cardiac Enzymes: No results for input(s): CKTOTAL, CKMB, CKMBINDEX, TROPONINI in the last 168 hours. BNP (last 3 results) No results for input(s): PROBNP in the last 8760 hours. HbA1C: No results for input(s): HGBA1C in the last 72 hours. CBG: No results for input(s): GLUCAP in the last 168 hours. Lipid Profile: No results for input(s): CHOL, HDL, LDLCALC, TRIG, CHOLHDL, LDLDIRECT in the last 72 hours. Thyroid Function Tests: No results for input(s): TSH, T4TOTAL, FREET4, T3FREE, THYROIDAB in the last 72 hours. Anemia Panel: No results for input(s): VITAMINB12, FOLATE, FERRITIN, TIBC, IRON, RETICCTPCT in the last 72 hours. Urine analysis:    Component Value Date/Time   LABSPEC 1.025 05/01/2017 1409   PHURINE 6.5 05/01/2017 1409   GLUCOSEU NEGATIVE 05/01/2017 1409   HGBUR NEGATIVE 05/01/2017 1409   BILIRUBINUR NEGATIVE 05/01/2017 1409   KETONESUR NEGATIVE 05/01/2017 1409   PROTEINUR NEGATIVE 05/01/2017 1409   UROBILINOGEN 0.2 05/01/2017 1409   NITRITE NEGATIVE 05/01/2017 1409   LEUKOCYTESUR NEGATIVE 05/01/2017 1409    Radiological Exams on Admission: DG Chest 2 View  Result Date: 10/01/2019 CLINICAL DATA:  Chest pain EXAM: CHEST - 2 VIEW COMPARISON:  11/15/2016 FINDINGS: The lungs are clear without focal pneumonia, edema, pneumothorax or pleural  effusion. Nodular density/densities projecting over the lungs are compatible with pads for telemetry leads. The cardiopericardial silhouette is within normal limits for size. The visualized bony structures of the thorax are intact. IMPRESSION: No active cardiopulmonary disease. Electronically Signed   By: Misty Stanley M.D.   On: 10/01/2019 16:54    EKG: Independently reviewed.  Sinus rhythm, LVH, no acute ischemic changes.  Assessment/Plan Principal Problem:   Hypertensive urgency Active Problems:   Chest pain   Tobacco use   Hypertensive urgency: Blood pressure improved after receiving hydralazine and labetalol in the ED.  Systolic currently in 440N. Plan: Start amlodipine 5 mg daily and chlorthalidone 12.5 mg daily. Continue hydralazine PRN and labetalol PRN SBP >160.  Order echocardiogram given LVH on EKG.  Chest pain: Appears atypical based on history and suspect is due to hypertensive crisis.  Does have risk factors for CAD including uncontrolled hypertension and tobacco use.  However, work-up currently not suggestive of ACS  given high-sensitivity troponin negative x2 and EKG without acute ischemic changes.  Chest pain improved significantly after improvement in blood pressure.  Currently appears comfortable. Plan: Continue cardiac monitoring.  Order echocardiogram.  Tobacco use: Patient states he stopped smoking 2 weeks ago.  DVT prophylaxis: Lovenox Code Status: Full code Family Communication: No family available at this time. Disposition Plan: Anticipate discharge after clinical improvement. Consults called: None Admission status: It is my clinical opinion that referral for OBSERVATION is reasonable and necessary in this patient based on the above information provided. The aforementioned taken together are felt to place the patient at high risk for further clinical deterioration. However it is anticipated that the patient may be medically stable for discharge from the hospital  within 24 to 48 hours.  The medical decision making on this patient was of high complexity and the patient is at high risk for clinical deterioration, therefore this is a level 3 visit.  Shela Leff MD [facility]  If 7PM-7AM, please contact night-coverage [email or web address]  10/02/2019, 2:42 AM

## 2019-10-02 NOTE — Discharge Summary (Signed)
Physician Discharge Summary  Paul Molina ZOX:[id number] DOB: 1976-07-04 DOA: 10/01/2019  PCP: Patient, No Pcp Per  Admit date: 10/01/2019 Discharge date: 10/02/2019  Admitted From: Home Disposition: Home  Recommendations for Outpatient Follow-up:  1. Follow ups as below. 2. Please obtain CBC/BMP/Mag at follow up 3. Please follow up on the following pending results: None  Home Health: None Equipment/Devices: None  Discharge Condition: Stable CODE STATUS: Full code  Follow-up Information    Gildardo Pounds, NP Follow up on 10/17/2019.   Specialty: Nurse Practitioner Why: @ 3:30 pm for hospital follow up with Geryl Rankins- If you cannot make this scheduled appointment- please call to reschedule.  Contact information: [place] [address] [phone]           Hospital Course: 44 year old male with history of essential hypertension presenting with chest pain, dizzy and high blood pressure in the setting of not taking his antihypertensive medication for 2 weeks.  Reportedly ran out of his medication and has no PCP for refill.  In ED, SBP as high as 205.  DBP as high as 123.  BMP and CBC without significant finding.  High-sensitivity troponin 6> 5.  EKG without acute ischemic finding but mild LVH.  COVID-19 antigen and PCR negative.  HIV nonreactive.  Two view CXR without acute finding.  Patient was admitted for hypertensive urgency and chest pain.  Blood pressure improved with as needed and scheduled antihypertensive medication.  Chest pain and dizziness resolved.  Echocardiogram with EF of 60 to 65%, moderate concentric LVH and G1 DD but no other significant finding.  Patient was discharged on amlodipine 10 mg daily.  Outpatient follow-up as above.  He was counseled on the importance of compliance with his medication.   See individual problem list below for more hospital course.  Discharge Diagnoses:  Hypertensive urgency: Likely due to noncompliance  with his medication.  BP improved. -Discharged on amlodipine 10 mg daily -Counseled on the importance of compliance. -Outpatient follow-up scheduled as above  Atypical chest pain: Could be related to his blood pressure.  Resolved.  High-sensitivity troponin negative x2.  EKG with marked LVH but no acute ischemic finding.  Emphasized the importance of having his blood pressure under good control. HEART score 1.  Abnormal echocardiogram: Moderate concentric LVH with G1 DD.  Likely due to uncontrolled hypertension.  Appears euvolemic. -Management as above  Discharge Instructions  Discharge Instructions    Call MD for:  difficulty breathing, headache or visual disturbances   Complete by: As directed    Call MD for:  persistant dizziness or light-headedness   Complete by: As directed    Call MD for:  severe uncontrolled pain   Complete by: As directed    Diet - low sodium heart healthy   Complete by: As directed    Discharge instructions   Complete by: As directed    It has been a pleasure taking care of you! You were hospitalized with very high blood pressure, chest pain and dizziness likely due to uncontrolled blood pressure from not taking your medication.  We restarted you on amlodipine.  We encourage you to take this medication consistently.  We also recommend quitting smoking cigarettes.  Please follow-up or establish care with a primary care doctor as soon as possible.  Please think about quitting smoking.  This is very important for your health.  You can also call 1-800-QUIT-NOW [phone]) for free smoking cessation counseling.    Take care,   Increase  activity slowly   Complete by: As directed      Allergies as of 10/02/2019   No Known Allergies     Medication List    STOP taking these medications   predniSONE 10 MG (21) Tbpk tablet Commonly known as: STERAPRED UNI-PAK 21 TAB     TAKE these medications   amLODipine 10 MG tablet Commonly known as: NORVASC Take  1 tablet (10 mg total) by mouth daily. What changed:   medication strength  how much to take       Consultations:  None  Procedures/Studies:  2D Echo on 10/02/2019 1. Left ventricular ejection fraction, by estimation, is 60 to 65%. The  left ventricle has normal function. The left ventricle has no regional  wall motion abnormalities. There is moderate concentric left ventricular  hypertrophy. Left ventricular  diastolic parameters are consistent with Grade I diastolic dysfunction  (impaired relaxation).  2. Right ventricular systolic function is normal. The right ventricular  size is normal. Tricuspid regurgitation signal is inadequate for assessing  PA pressure.  3. The mitral valve is normal in structure and function. No evidence of  mitral valve regurgitation. No evidence of mitral stenosis.  4. The aortic valve is normal in structure and function. Aortic valve  regurgitation is not visualized. No aortic stenosis is present.  5. The inferior vena cava is normal in size with <50% respiratory  variability, suggesting right atrial pressure of 8 mmHg.    DG Chest 2 View  Result Date: 10/01/2019 CLINICAL DATA:  Chest pain EXAM: CHEST - 2 VIEW COMPARISON:  11/15/2016 FINDINGS: The lungs are clear without focal pneumonia, edema, pneumothorax or pleural effusion. Nodular density/densities projecting over the lungs are compatible with pads for telemetry leads. The cardiopericardial silhouette is within normal limits for size. The visualized bony structures of the thorax are intact. IMPRESSION: No active cardiopulmonary disease. Electronically Signed   By: Misty Stanley M.D.   On: 10/01/2019 16:54   ECHOCARDIOGRAM COMPLETE  Result Date: 10/02/2019    ECHOCARDIOGRAM REPORT   Patient Name:   Paul Molina Date of Exam: 10/02/2019 Medical Rec #:  [id number]      Height:       73.0 in Accession #:    [id number]     Weight:       169.9 lb Date of Birth:  June 15, 1976      BSA:           2.008 m Patient Age:    44 years       BP:           152/100 mmHg Patient Gender: M              HR:           78 bpm. Exam Location:  Inpatient Procedure: 2D Echo, Color Doppler and Cardiac Doppler Indications:    R07.9* Chest pain, unspecified  History:        Patient has no prior history of Echocardiogram examinations.                 Signs/Symptoms:Chest Pain; Risk Factors:Hypertension.  Sonographer:    Raquel Sarna Senior RDCS Referring Phys: [id number] [place]  1. Left ventricular ejection fraction, by estimation, is 60 to 65%. The left ventricle has normal function. The left ventricle has no regional wall motion abnormalities. There is moderate concentric left ventricular hypertrophy. Left ventricular diastolic parameters are consistent with Grade I diastolic dysfunction (impaired relaxation).  2. Right ventricular systolic function  is normal. The right ventricular size is normal. Tricuspid regurgitation signal is inadequate for assessing PA pressure.  3. The mitral valve is normal in structure and function. No evidence of mitral valve regurgitation. No evidence of mitral stenosis.  4. The aortic valve is normal in structure and function. Aortic valve regurgitation is not visualized. No aortic stenosis is present.  5. The inferior vena cava is normal in size with <50% respiratory variability, suggesting right atrial pressure of 8 mmHg. FINDINGS  Left Ventricle: Left ventricular ejection fraction, by estimation, is 60 to 65%. The left ventricle has normal function. The left ventricle has no regional wall motion abnormalities. The left ventricular internal cavity size was normal in size. There is  moderate concentric left ventricular hypertrophy. Left ventricular diastolic parameters are consistent with Grade I diastolic dysfunction (impaired relaxation). Normal left ventricular filling pressure. Right Ventricle: The right ventricular size is normal. No increase in right ventricular wall thickness.  Right ventricular systolic function is normal. Tricuspid regurgitation signal is inadequate for assessing PA pressure. Left Atrium: Left atrial size was normal in size. Right Atrium: Right atrial size was normal in size. Pericardium: There is no evidence of pericardial effusion. Mitral Valve: The mitral valve is normal in structure and function. Normal mobility of the mitral valve leaflets. No evidence of mitral valve regurgitation. No evidence of mitral valve stenosis. Tricuspid Valve: The tricuspid valve is normal in structure. Tricuspid valve regurgitation is not demonstrated. No evidence of tricuspid stenosis. Aortic Valve: The aortic valve is normal in structure and function. Aortic valve regurgitation is not visualized. No aortic stenosis is present. Pulmonic Valve: The pulmonic valve was normal in structure. Pulmonic valve regurgitation is not visualized. No evidence of pulmonic stenosis. Aorta: The aortic root is normal in size and structure. Venous: The inferior vena cava is normal in size with less than 50% respiratory variability, suggesting right atrial pressure of 8 mmHg. IAS/Shunts: No atrial level shunt detected by color flow Doppler.  LEFT VENTRICLE PLAX 2D LVIDd:         3.90 cm  Diastology LVIDs:         2.10 cm  LV e' lateral:   6.20 cm/s LV PW:         1.40 cm  LV E/e' lateral: 9.3 LV IVS:        1.30 cm  LV e' medial:    7.07 cm/s LVOT diam:     2.00 cm  LV E/e' medial:  8.2 LV SV:         86 LV SV Index:   43 LVOT Area:     3.14 cm  RIGHT VENTRICLE RV S prime:     18.30 cm/s TAPSE (M-mode): 2.4 cm LEFT ATRIUM             Index LA diam:        3.10 cm 1.54 cm/m LA Vol (A2C):   49.4 ml 24.61 ml/m LA Vol (A4C):   37.0 ml 18.43 ml/m LA Biplane Vol: 44.2 ml 22.02 ml/m  AORTIC VALVE LVOT Vmax:   133.00 cm/s LVOT Vmean:  98.700 cm/s LVOT VTI:    0.274 m  AORTA Ao Root diam: 2.60 cm Ao Asc diam:  3.20 cm MITRAL VALVE MV Area (PHT): 3.17 cm    SHUNTS MV Decel Time: 239 msec    Systemic VTI:  0.27  m MV E velocity: 57.70 cm/s  Systemic Diam: 2.00 cm MV A velocity: 66.50 cm/s MV E/A ratio:  0.87 Traci  Turner MD Electronically signed by Fransico Him MD Signature Date/Time: 10/02/2019/9:26:07 AM    Final        Discharge Exam: Vitals:   10/02/19 0809 10/02/19 0942  BP: (!) 155/113 (!) 159/106  Pulse: 74 93  Resp:  19  Temp: 98.2 F (36.8 C)   SpO2: 98% 96%    GENERAL: No acute distress.  Appears well.  HEENT: MMM.  Vision and hearing grossly intact.  NECK: Supple.  No apparent JVD.  RESP:  No IWOB. Good air movement bilaterally. CVS:  RRR. Heart sounds normal.  ABD/GI/GU: Bowel sounds present. Soft. Non tender.  MSK/EXT:  Moves extremities. No apparent deformity or edema.  SKIN: no apparent skin lesion or wound NEURO: Awake, alert and oriented appropriately.  No apparent focal neuro deficit. PSYCH: Calm. Normal affect.   The results of significant diagnostics from this hospitalization (including imaging, microbiology, ancillary and laboratory) are listed below for reference.     Microbiology: Recent Results (from the past 240 hour(s))  SARS CORONAVIRUS 2 (TAT 6-24 HRS) Nasopharyngeal Nasopharyngeal Swab     Status: None   Collection Time: 10/01/19 11:25 PM   Specimen: Nasopharyngeal Swab  Result Value Ref Range Status   SARS Coronavirus 2 NEGATIVE NEGATIVE Final    Comment: (NOTE) SARS-CoV-2 target nucleic acids are NOT DETECTED. The SARS-CoV-2 RNA is generally detectable in upper and lower respiratory specimens during the acute phase of infection. Negative results do not preclude SARS-CoV-2 infection, do not rule out co-infections with other pathogens, and should not be used as the sole basis for treatment or other patient management decisions. Negative results must be combined with clinical observations, patient history, and epidemiological information. The expected result is Negative. Fact Sheet for Patients: SugarRoll.be Fact Sheet  for Healthcare Providers: https://www.woods-mathews.com/ This test is not yet approved or cleared by the [place] FDA and  has been authorized for detection and/or diagnosis of SARS-CoV-2 by FDA under an Emergency Use Authorization (EUA). This EUA will remain  in effect (meaning this test can be used) for the duration of the COVID-19 declaration under Section 56 4(b)(1) of the Act, 21 U.S.C. section 360bbb-3(b)(1), unless the authorization is terminated or revoked sooner. Performed at [facility], [place] [address], [place],  [address]      Labs: BNP (last 3 results) No results for input(s): BNP in the last 8760 hours. Basic Metabolic Panel: Recent Labs  Lab 10/01/19 1610  NA 135  K 3.7  CL 97*  CO2 27  GLUCOSE 115*  BUN 7  CREATININE 0.90  CALCIUM 9.0   Liver Function Tests: No results for input(s): AST, ALT, ALKPHOS, BILITOT, PROT, ALBUMIN in the last 168 hours. No results for input(s): LIPASE, AMYLASE in the last 168 hours. No results for input(s): AMMONIA in the last 168 hours. CBC: Recent Labs  Lab 10/01/19 1610  WBC 3.8*  HGB 15.4  HCT 44.5  MCV 94.7  PLT 226   Cardiac Enzymes: No results for input(s): CKTOTAL, CKMB, CKMBINDEX, TROPONINI in the last 168 hours. BNP: Invalid input(s): POCBNP CBG: No results for input(s): GLUCAP in the last 168 hours. D-Dimer No results for input(s): DDIMER in the last 72 hours. Hgb A1c No results for input(s): HGBA1C in the last 72 hours. Lipid Profile No results for input(s): CHOL, HDL, LDLCALC, TRIG, CHOLHDL, LDLDIRECT in the last 72 hours. Thyroid function studies No results for input(s): TSH, T4TOTAL, T3FREE, THYROIDAB in the last 72 hours.  Invalid input(s): FREET3 Anemia work  up No results for input(s): VITAMINB12, FOLATE, FERRITIN, TIBC, IRON, RETICCTPCT in the last 72 hours. Urinalysis    Component Value Date/Time   LABSPEC 1.025 05/01/2017 1409   PHURINE 6.5 05/01/2017 1409    GLUCOSEU NEGATIVE 05/01/2017 1409   HGBUR NEGATIVE 05/01/2017 1409   BILIRUBINUR NEGATIVE 05/01/2017 1409   KETONESUR NEGATIVE 05/01/2017 1409   PROTEINUR NEGATIVE 05/01/2017 1409   UROBILINOGEN 0.2 05/01/2017 1409   NITRITE NEGATIVE 05/01/2017 1409   LEUKOCYTESUR NEGATIVE 05/01/2017 1409   Sepsis Labs Invalid input(s): PROCALCITONIN,  WBC,  LACTICIDVEN   Time coordinating discharge: 25 minutes  SIGNED:  Mercy Riding, MD  [facility] 10/02/2019, 2:56 PM  If 7PM-7AM, please contact night-coverage [email or web address] Password [email or web address]

## 2019-10-02 NOTE — Progress Notes (Signed)
Echocardiogram 2D Echocardiogram has been performed.  Oneal Deputy Delio Slates 10/02/2019, 9:18 AM

## 2019-10-17 ENCOUNTER — Inpatient Hospital Stay: Payer: Self-pay | Admitting: Nurse Practitioner

## 2019-10-20 ENCOUNTER — Encounter: Payer: Self-pay | Admitting: Nurse Practitioner

## 2019-10-20 ENCOUNTER — Ambulatory Visit: Payer: Self-pay | Attending: Nurse Practitioner | Admitting: Nurse Practitioner

## 2019-10-20 ENCOUNTER — Other Ambulatory Visit: Payer: Self-pay

## 2019-10-20 VITALS — Ht 73.0 in

## 2019-10-20 DIAGNOSIS — Z7689 Persons encountering health services in other specified circumstances: Secondary | ICD-10-CM

## 2019-10-20 DIAGNOSIS — I10 Essential (primary) hypertension: Secondary | ICD-10-CM

## 2019-10-20 NOTE — Progress Notes (Signed)
Virtual Visit via Telephone Note Due to national recommendations of social distancing due to [place] 19, telehealth visit is felt to be most appropriate for this patient at this time.  I discussed the limitations, risks, security and privacy concerns of performing an evaluation and management service by telephone and the availability of in person appointments. I also discussed with the patient that there may be a patient responsible charge related to this service. The patient expressed understanding and agreed to proceed.    I connected with Paul Molina on 10/20/19  at   9:10 AM EDT  EDT by telephone and verified that I am speaking with the correct person using two identifiers.   Consent I discussed the limitations, risks, security and privacy concerns of performing an evaluation and management service by telephone and the availability of in person appointments. I also discussed with the patient that there may be a patient responsible charge related to this service. The patient expressed understanding and agreed to proceed.   Location of Patient: Private Residence   Location of Provider: [place] [facility] [employer] Office    Persons participating in Telemedicine visit: Paul Rankins FNP-BC [place]    History of Present Illness: Telemedicine visit for: Establish Care  Essential Hypertension Was admitted to the hospital due to uncontrolled HTN 10-01-2019 after being out of his blood pressure medication for 2 weeks.  Blood pressure in the ED was 205/123.    There are no electrolyte abnormalities.  EKG without acute ischemic finding but mild LVH.  Chest x-ray within normal limits echocardiogram with EF of 60 to 65%.  Patient was admitted for hypertensive urgency and chest pain.  He was discharged home in stable condition on amlodipine 10 mg which she currently states he is still taking.  Unfortunately he has not purchased a blood pressure device so he has not been  able to check his blood pressure at home.  He states he was diagnosed with HTN over 10 years ago . Endorses intermittent chest pain which he feels is likely related to high stress.  He does have a history of anxiety.  Denies  shortness of breath, palpitations, lightheadedness, dizziness, headaches or BLE edema.  BP Readings from Last 3 Encounters:  10/02/19 (!) 159/106  09/05/17 (!) 151/123  05/01/17 (!) 192/110     Past Medical History:  Diagnosis Date  . Anxiety   . Hypertension     Past Surgical History:  Procedure Laterality Date  . NO PAST SURGERIES      Family History  Problem Relation Age of Onset  . Hypertension Mother   . Hypertension Father   . Stroke Father     Social History   Socioeconomic History  . Marital status: Single    Spouse name: Not on file  . Number of children: Not on file  . Years of education: Not on file  . Highest education level: Not on file  Occupational History  . Not on file  Tobacco Use  . Smoking status: Former Smoker    Packs/day: 0.20    Types: Cigarettes  . Smokeless tobacco: Never Used  Substance and Sexual Activity  . Alcohol use: Yes  . Drug use: Not Currently  . Sexual activity: Yes  Other Topics Concern  . Not on file  Social History Narrative  . Not on file   Social Determinants of Health   Financial Resource Strain:   . Difficulty of Paying Living Expenses:   Food Insecurity:   .  Worried About Charity fundraiser in the Last Year:   . Arboriculturist in the Last Year:   Transportation Needs:   . Film/video editor (Medical):   Marland Kitchen Lack of Transportation (Non-Medical):   Physical Activity:   . Days of Exercise per Week:   . Minutes of Exercise per Session:   Stress:   . Feeling of Stress :   Social Connections:   . Frequency of Communication with Friends and Family:   . Frequency of Social Gatherings with Friends and Family:   . Attends Religious Services:   . Active Member of Clubs or Organizations:   .  Attends Archivist Meetings:   Marland Kitchen Marital Status:      Observations/Objective: Awake, alert and oriented x 3   Review of Systems  Constitutional: Negative for fever, malaise/fatigue and weight loss.  HENT: Negative.  Negative for nosebleeds.   Eyes: Negative.  Negative for blurred vision, double vision and photophobia.  Respiratory: Negative.  Negative for cough and shortness of breath.   Cardiovascular: Negative.  Negative for chest pain, palpitations and leg swelling.  Gastrointestinal: Negative.  Negative for heartburn, nausea and vomiting.  Musculoskeletal: Negative.  Negative for myalgias.  Neurological: Negative.  Negative for dizziness, focal weakness, seizures and headaches.  Psychiatric/Behavioral: Negative for suicidal ideas. The patient is nervous/anxious.     Assessment and Plan: [name] was seen today for hospitalization follow-up.  Diagnoses and all orders for this visit:  Encounter to establish care  Essential hypertension Continue amlodipine 10 mg as prescribed.  Remember to bring in your blood pressure log with you for your follow up appointment.  DASH/Mediterranean Diets are healthier choices for HTN.     Follow Up Instructions Return in about 4 weeks (around 11/17/2019) for BP recheck.     I discussed the assessment and treatment plan with the patient. The patient was provided an opportunity to ask questions and all were answered. The patient agreed with the plan and demonstrated an understanding of the instructions.   The patient was advised to call back or seek an in-person evaluation if the symptoms worsen or if the condition fails to improve as anticipated.  I provided 15 minutes of non-face-to-face time during this encounter including median intraservice time, reviewing previous notes, labs, imaging, medications and explaining diagnosis and management.  Gildardo Pounds, FNP-BC

## 2019-11-18 ENCOUNTER — Ambulatory Visit: Payer: Self-pay | Admitting: Nurse Practitioner

## 2021-07-30 IMAGING — CR DG CHEST 2V
2 series · 2 of 2 positions shown · non-contrast
Comparison: 11/15/2016

CLINICAL DATA: Chest pain

EXAM:
CHEST - 2 VIEW

[chest pa]
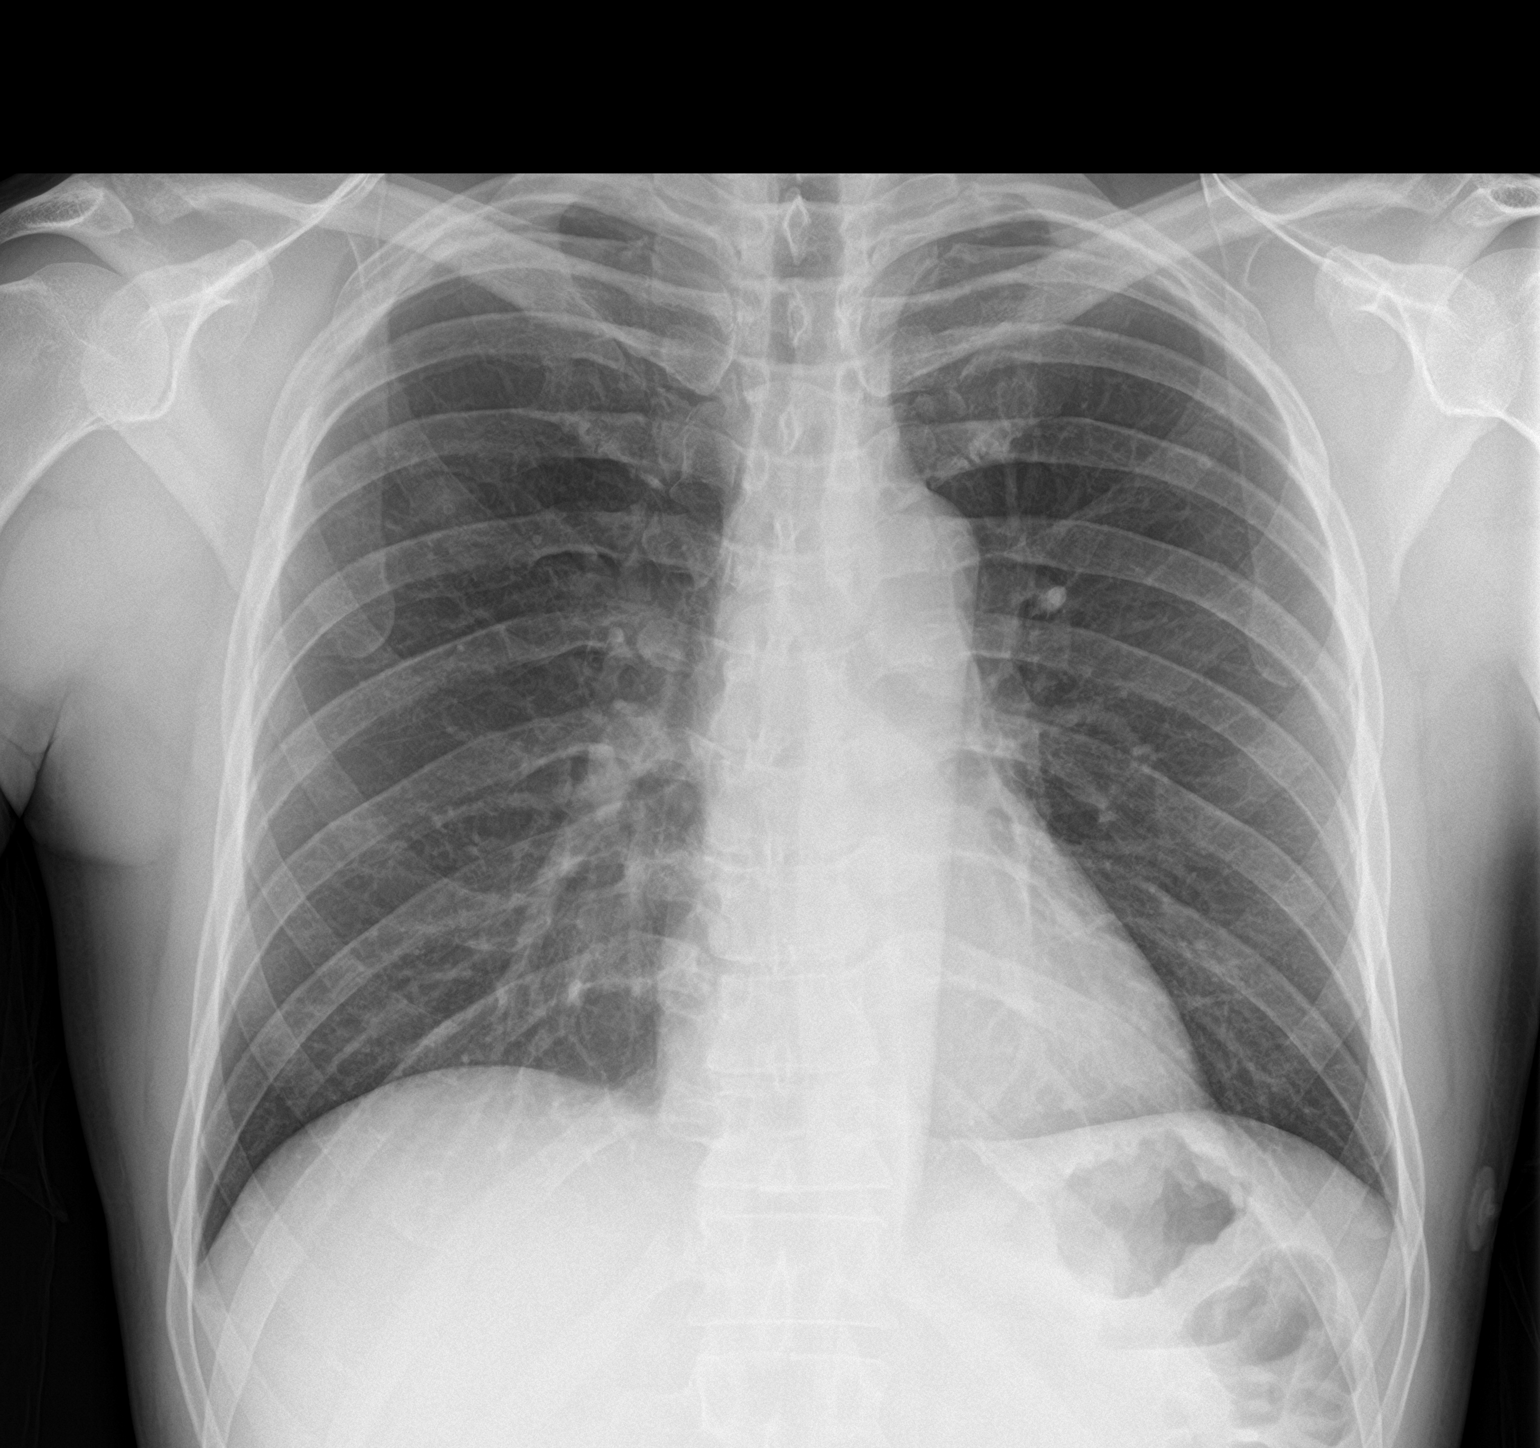

[chest lat]
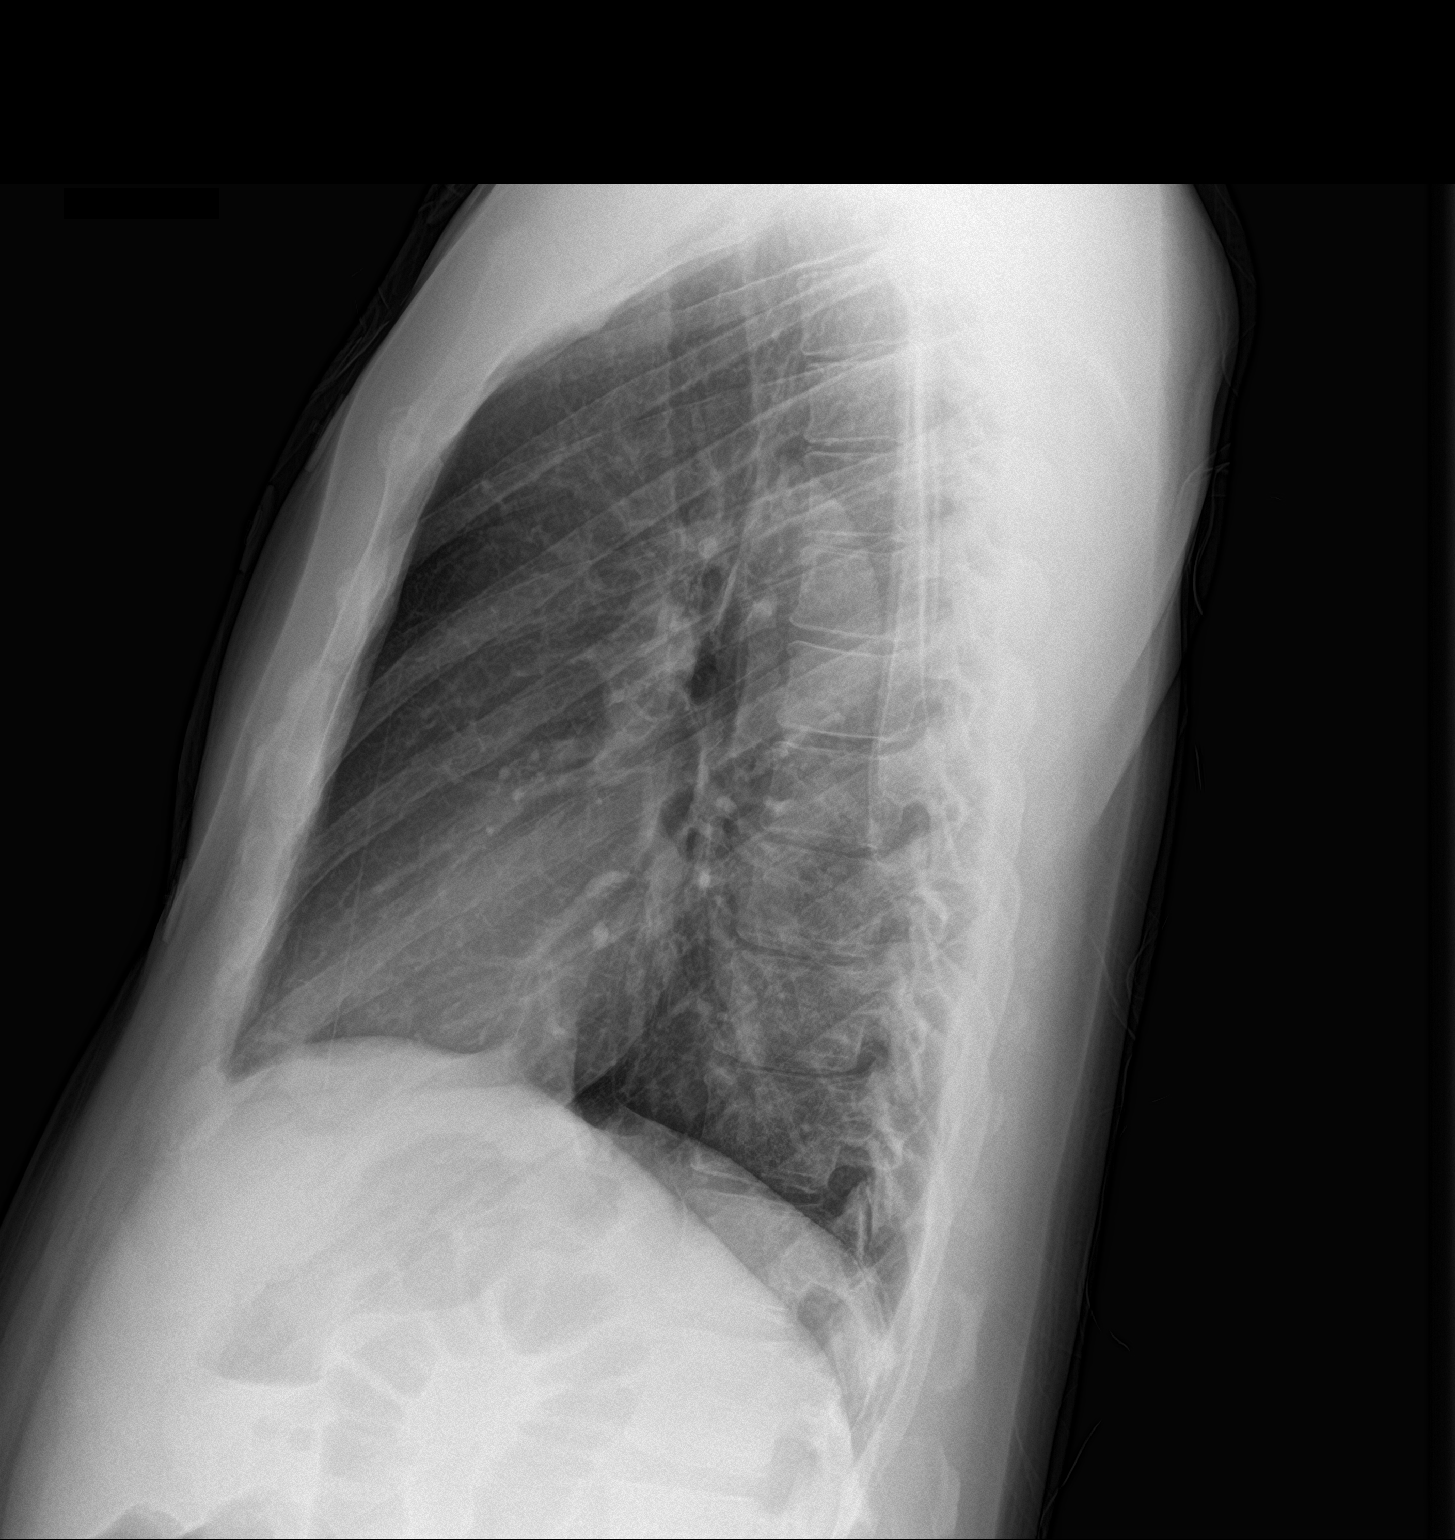

[2 of 2 positions shown; findings below may reference images not displayed]

FINDINGS: The lungs are clear without focal pneumonia, edema, pneumothorax or
pleural effusion. Nodular density/densities projecting over the
lungs are compatible with pads for telemetry leads. The
cardiopericardial silhouette is within normal limits for size. The
visualized bony structures of the thorax are intact.
IMPRESSION: No active cardiopulmonary disease.

## 2021-10-13 ENCOUNTER — Other Ambulatory Visit: Payer: Self-pay

## 2021-10-13 ENCOUNTER — Encounter: Payer: Self-pay | Admitting: Internal Medicine

## 2021-10-13 ENCOUNTER — Ambulatory Visit: Payer: Managed Care, Other (non HMO) | Attending: Internal Medicine | Admitting: Internal Medicine

## 2021-10-13 VITALS — BP 220/139 | HR 98 | Ht 72.5 in | Wt 197.6 lb

## 2021-10-13 DIAGNOSIS — M26609 Unspecified temporomandibular joint disorder, unspecified side: Secondary | ICD-10-CM

## 2021-10-13 DIAGNOSIS — Z0000 Encounter for general adult medical examination without abnormal findings: Secondary | ICD-10-CM

## 2021-10-13 DIAGNOSIS — F17200 Nicotine dependence, unspecified, uncomplicated: Secondary | ICD-10-CM

## 2021-10-13 DIAGNOSIS — I10 Essential (primary) hypertension: Secondary | ICD-10-CM

## 2021-10-13 DIAGNOSIS — Z23 Encounter for immunization: Secondary | ICD-10-CM

## 2021-10-13 DIAGNOSIS — Z1211 Encounter for screening for malignant neoplasm of colon: Secondary | ICD-10-CM

## 2021-10-13 DIAGNOSIS — E663 Overweight: Secondary | ICD-10-CM

## 2021-10-13 HISTORY — DX: Unspecified temporomandibular joint disorder, unspecified side: M26.609

## 2021-10-13 MED ORDER — NICOTINE 14 MG/24HR TD PT24
14.0000 mg | MEDICATED_PATCH | Freq: Every day | TRANSDERMAL | 2 refills | Status: DC
  Filled 2021-10-13: qty 28, 28d supply, fill #0

## 2021-10-13 MED ORDER — CHLORTHALIDONE 25 MG PO TABS
12.5000 mg | ORAL_TABLET | Freq: Every day | ORAL | 1 refills | Status: DC
  Filled 2021-10-13: qty 15, 30d supply, fill #0

## 2021-10-13 MED ORDER — AMLODIPINE BESYLATE 10 MG PO TABS
10.0000 mg | ORAL_TABLET | Freq: Every day | ORAL | 4 refills | Status: DC
  Filled 2021-10-13: qty 30, 30d supply, fill #0
  Filled 2021-11-15: qty 30, 30d supply, fill #1
  Filled 2021-12-16: qty 30, 30d supply, fill #2
  Filled 2022-01-17: qty 30, 30d supply, fill #3
  Filled 2022-02-19: qty 30, 30d supply, fill #4

## 2021-10-13 NOTE — Progress Notes (Signed)
Patient ID: Paul Molina, male    DOB: 03/05/1976  MRN: [id number]  CC: Annual Exam (Physical , and elevated for work )   Subjective: Paul Molina is a 46 y.o. male who presents for physical.  PCP is Geryl Rankins.  Last visit with her 10/2019 His concerns today include:  Pt with hx of HTN  Patient presents today for physical so that he can return to work.  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  He has been out of work since March of last year.  His mother died in 2022-10-15 of last year and he went to [place] for short period to attend the funeral.  He went through some grieving.  He did not have enough time on the books at work to request a leave of absence or FMLA so he just stayed out of work.  He tried to return in September of last year but I told him that he needed to have physical and mental evaluation prior to return to work. -Patient denies any history of depression, anxiety or other mental disorder.  He has never been on psychiatric medications.  No suicidal ideation or homicidal ideation  HYPERTENSION Currently taking: Patient is supposed to be on Norvasc 10 mg daily.  However he has been out of it for a while. Med Adherence: []  Yes    [x]  No Medication side effects: []  Yes    [x]  No Adherence with salt restriction: [x]  Yes    []  No Home Monitoring?: []  Yes    [x]  No.  He reports her blood pressure was high even when he was on the Norvasc. Monitoring Frequency:  Home BP results range:  SOB? []  Yes    [x]  No Chest Pain?: []  Yes    [x]  No Leg swelling?: []  Yes    [x]  No Headaches?: []  Yes    [x]  No Dizziness? []  Yes    [x]  No Comments:   Tob dep: Patient smokes 2 to 3 cigarettes/day.  The most he ever smoked was half a pack a day.  He has smoked since 15-Oct-1997.  He quit for about 1 year at some point but restarted due to stress.  He would like to quit and is thinking about trying the nicotine patches. He drinks a 12 ounce can of beer a day.  Denies any street drug use.  HM:   due for flu and Tdapt vaccines. He agrees to receive both today. Due for Colon CA screen.  No family history of colon cancer. Patient Active Problem List   Diagnosis Date Noted   Chest pain 10/02/2019   Tobacco use 10/02/2019   Hypertensive urgency 10/01/2019     No current outpatient medications on file prior to visit.   No current facility-administered medications on file prior to visit.    No Known Allergies  Social History   Socioeconomic History   Marital status: Single    Spouse name: Not on file   Number of children: 2   Years of education: Not on file   Highest education level: Some college, no degree  Occupational History    Employer: [employer]  Tobacco Use   Smoking status: Every Day    Packs/day: 0.25    Types: Cigarettes   Smokeless tobacco: Never  Vaping Use   Vaping Use: Never used  Substance and Sexual Activity   Alcohol use: Yes    Comment: drinks 24 can beer daily   Drug use: Not Currently  Sexual activity: Yes  Other Topics Concern   Not on file  Social History Narrative   Not on file   Social Determinants of Health   Financial Resource Strain: Not on file  Food Insecurity: Not on file  Transportation Needs: Not on file  Physical Activity: Not on file  Stress: Not on file  Social Connections: Not on file  Intimate Partner Violence: Not on file    Family History  Problem Relation Age of Onset   Hypertension Mother    Hypertension Father    Stroke Father     Past Surgical History:  Procedure Laterality Date   NO PAST SURGERIES      ROS: Review of Systems  Constitutional:  Negative for appetite change.       Patient reports he does not exercise but does a lot of walking.  HENT:  Positive for dental problem (sometimes has clicking of the TMJ BL.  No pain with chewing). Negative for congestion, hearing loss, sinus pressure, sore throat and trouble swallowing.        He does not have dental insurance.  He has not had a dental  exam in quite some time.  Eyes:        He wears bifocals.  Last eye exam was about 2 years ago.  Respiratory:  Negative for cough and shortness of breath.   Cardiovascular:  Negative for chest pain and palpitations.  Gastrointestinal:  Negative for abdominal pain and blood in stool.       He moves his bowels regularly.  Genitourinary:  Negative for difficulty urinating and hematuria.  Musculoskeletal:  Negative for arthralgias and joint swelling.  Skin:  Negative for rash.  Neurological:  Negative for dizziness and headaches.  Psychiatric/Behavioral:  Negative for dysphoric mood. The patient is not nervous/anxious.   Negative except as stated above  PHYSICAL EXAM: BP (!) 220/139    Pulse 98    Ht 6' 0.5" (1.842 m)    Wt 197 lb 9.6 oz (89.6 kg)    SpO2 98%    BMI 26.43 kg/m   Wt Readings from Last 3 Encounters:  10/13/21 197 lb 9.6 oz (89.6 kg)  10/02/19 169 lb 14.4 oz (77.1 kg)  05/01/17 171 lb (77.6 kg)    Physical Exam Repeat blood pressure 229/136  General appearance - alert, well appearing, middle-aged African-American male and in no distress Mental status - normal mood, behavior, speech, dress, motor activity, and thought processes Eyes - pupils equal and reactive, extraocular eye movements intact Ears - bilateral TM's and external ear canals normal Nose - normal and patent, no erythema, discharge or polyps Mouth - mucous membranes moist, pharynx normal without lesions.  He has some plaque buildup and receding gumline noted on the upper molars Neck - supple, no significant adenopathy Lymphatics - no palpable lymphadenopathy, no hepatosplenomegaly Chest - clear to auscultation, no wheezes, rales or rhonchi, symmetric air entry Heart - normal rate, regular rhythm, normal S1, S2, no murmurs, rubs, clicks or gallops Abdomen - soft, nontender, nondistended, no masses or organomegaly Neurological -cranial nerves intact.  He is actively moving all 4 extremities with good power.   Gait is normal. Extremities -no lower extremity edema.  He has clubbing of the fingernails. Skin: Dry skin on the legs and feet.  Nonulcerative callus noted on the medial aspect of the left big toe Depression screen [facility] 2/9 10/13/2021 10/20/2019  Decreased Interest 0 0  Down, Depressed, Hopeless 0 0  PHQ - 2 Score  0 0      CMP Latest Ref Rng & Units 10/01/2019 05/01/2017 11/15/2016  Glucose 70 - 99 mg/dL 115(H) 99 112(H)  BUN 6 - 20 mg/dL 7 10 13   Creatinine 0.61 - 1.24 mg/dL 0.90 0.90 0.87  Sodium 135 - 145 mmol/L 135 139 136  Potassium 3.5 - 5.1 mmol/L 3.7 4.4 3.6  Chloride 98 - 111 mmol/L 97(L) 101 95(L)  CO2 22 - 32 mmol/L 27 - 27  Calcium 8.9 - 10.3 mg/dL 9.0 - 9.4   Lipid Panel  No results found for: CHOL, TRIG, HDL, CHOLHDL, VLDL, LDLCALC, LDLDIRECT  CBC    Component Value Date/Time   WBC 3.8 (L) 10/01/2019 1610   RBC 4.70 10/01/2019 1610   HGB 15.4 10/01/2019 1610   HCT 44.5 10/01/2019 1610   PLT 226 10/01/2019 1610   MCV 94.7 10/01/2019 1610   MCH 32.8 10/01/2019 1610   MCHC 34.6 10/01/2019 1610   RDW 12.6 10/01/2019 1610    ASSESSMENT AND PLAN: 1. Physical exam, annual -Patient at this time based on history and exam is physically and mentally able to return to work.  However his blood pressure is quite elevated.  I recommend that we get him back on medication and try to get his blood pressure down some before he returns to work.  I will be out of the office next week for CME so I will have him come back to see his PCP for repeat blood pressure check and let have released to return to work. -Encourage eye exams at least once every 2 years. -Encouraged him to try to get dental insurance so that he can get routine dental care every 6 months. -Went over recommendation of safe drinking.  For men it should be no more than 2 standard drinks a day.  He currently drinks one 24 oz a day which is equivalent to two 12 oz.  2. Essential hypertension Not at goal and quite  elevated.  Advised of cardiovascular risks associated with uncontrolled blood pressure.  Our goal is to get him at 130/80 or lower. Restart amlodipine along with low-dose of chlorthalidone.  Advised that the chlorthalidone is a blood pressure medicine that has a diuretic effect and as such will cause increased urination. - CBC - Comprehensive metabolic panel - Lipid panel - amLODipine (NORVASC) 10 MG tablet; Take 1 tablet (10 mg total) by mouth daily.  Dispense: 30 tablet; Refill: 4 - chlorthalidone (HYGROTON) 25 MG tablet; Take 0.5 tablets (12.5 mg total) by mouth daily.  Dispense: 30 tablet; Refill: 1  3. Tobacco dependence Pt is current smoker. Patient advised to quit smoking. Discussed health risks associated with smoking including lung and other types of cancers, chronic lung diseases and CV risks.. Pt ready ready to give trail of quitting.   Discussed methods to help quit including quitting cold [place], use of NRT, Chantix and Bupropion.  Pt wanting to try: Nicotine patches.  Based on the amount that he currently smokes I think we should start him on the intermediate dose.  I went over with him how to use the nicotine patches and the stepdown approach. _3_ Minutes spent on counseling. F/U:    - nicotine (NICODERM CQ - DOSED IN MG/24 HOURS) 14 mg/24hr patch; Place 1 patch (14 mg total) onto the skin daily.  Dispense: 28 patch; Refill: 2  4. TMJ (temporomandibular joint syndrome) Discussed diagnosis.  Since this is not causing any pain or discomfort for him at this time we can  observe.  Encouraged him to try to get dental insurance  5. Overweight (BMI 25.0-29.9) Patient advised to eliminate sugary drinks from the diet, cut back on portion sizes especially of white carbohydrates, eat more white lean meat like chicken [place] and seafood instead of beef or pork and incorporate fresh fruits and vegetables into the diet daily. Encourage regular exercise with goal of getting in 30 minutes of  moderate intensity exercise at least 3 to 5 days a week for 30 minutes.  6. Need for influenza vaccination Patient agreeable to receiving flu vaccine - Flu Vaccine QUAD 6+ mos PF IM (Fluarix Quad PF)  7. Screening for colon cancer We discussed colon cancer screening and recommendations.  He is at average risk for colon cancer.  Patient prefers to have the Cologuard test - Cologuard  8. Need for Tdap vaccination - Tdap vaccine greater than or equal to 7yo IM    Patient was given the opportunity to ask questions.  Patient verbalized understanding of the plan and was able to repeat key elements of the plan.   This documentation was completed using Radio producer.  Any transcriptional errors are unintentional.  Orders Placed This Encounter  Procedures   Flu Vaccine QUAD 6+ mos PF IM (Fluarix Quad PF)   Tdap vaccine greater than or equal to 7yo IM   CBC   Comprehensive metabolic panel   Lipid panel   Cologuard     Requested Prescriptions   Signed Prescriptions Disp Refills   amLODipine (NORVASC) 10 MG tablet 30 tablet 4    Sig: Take 1 tablet (10 mg total) by mouth daily.   chlorthalidone (HYGROTON) 25 MG tablet 30 tablet 1    Sig: Take 0.5 tablets (12.5 mg total) by mouth daily.   nicotine (NICODERM CQ - DOSED IN MG/24 HOURS) 14 mg/24hr patch 28 patch 2    Sig: Place 1 patch (14 mg total) onto the skin daily.    Return in about 2 weeks (around 10/27/2021) for BP recheck.  Karle Plumber, MD, FACP

## 2021-10-13 NOTE — Patient Instructions (Addendum)
Start Amlopidine 10 mg and Chlorthalidone 25 mg 1/2 tab daily for blood pressure.  Our goal is to get your blood pressure at 130/80 or lower.    DASH Eating Plan DASH stands for Dietary Approaches to Stop Hypertension. The DASH eating plan is a healthy eating plan that has been shown to: Reduce high blood pressure (hypertension). Reduce your risk for type 2 diabetes, heart disease, and stroke. Help with weight loss. What are tips for following this plan? Reading food labels Check food labels for the amount of salt (sodium) per serving. Choose foods with less than 5 percent of the Daily Value of sodium. Generally, foods with less than 300 milligrams (mg) of sodium per serving fit into this eating plan. To find whole grains, look for the word "whole" as the first word in the ingredient list. Shopping Buy products labeled as "low-sodium" or "no salt added." Buy fresh foods. Avoid canned foods and pre-made or frozen meals. Cooking Avoid adding salt when cooking. Use salt-free seasonings or herbs instead of table salt or sea salt. Check with your health care provider or pharmacist before using salt substitutes. Do not fry foods. Cook foods using healthy methods such as baking, boiling, grilling, roasting, and broiling instead. Cook with heart-healthy oils, such as olive, canola, avocado, soybean, or sunflower oil. Meal planning  Eat a balanced diet that includes: 4 or more servings of fruits and 4 or more servings of vegetables each day. Try to fill one-half of your plate with fruits and vegetables. 6-8 servings of whole grains each day. Less than 6 oz (170 g) of lean meat, poultry, or fish each day. A 3-oz (85-g) serving of meat is about the same size as a deck of cards. One egg equals 1 oz (28 g). 2-3 servings of low-fat dairy each day. One serving is 1 cup (237 mL). 1 serving of nuts, seeds, or beans 5 times each week. 2-3 servings of heart-healthy fats. Healthy fats called omega-3 fatty  acids are found in foods such as walnuts, flaxseeds, fortified milks, and eggs. These fats are also found in cold-water fish, such as sardines, salmon, and mackerel. Limit how much you eat of: Canned or prepackaged foods. Food that is high in trans fat, such as some fried foods. Food that is high in saturated fat, such as fatty meat. Desserts and other sweets, sugary drinks, and other foods with added sugar. Full-fat dairy products. Do not salt foods before eating. Do not eat more than 4 egg yolks a week. Try to eat at least 2 vegetarian meals a week. Eat more home-cooked food and less restaurant, buffet, and fast food. Lifestyle When eating at a restaurant, ask that your food be prepared with less salt or no salt, if possible. If you drink alcohol: Limit how much you use to: 0-1 drink a day for women who are not pregnant. 0-2 drinks a day for men. Be aware of how much alcohol is in your drink. In the [place] one drink equals one 12 oz bottle of beer (355 mL), one 5 oz glass of wine (148 mL), or one 1 oz glass of hard liquor (44 mL). General information Avoid eating more than 2,300 mg of salt a day. If you have hypertension, you may need to reduce your sodium intake to 1,500 mg a day. Work with your health care provider to maintain a healthy body weight or to lose weight. Ask what an ideal weight is for you. Get at least 30 minutes of exercise  that causes your heart to beat faster (aerobic exercise) most days of the week. Activities may include walking, swimming, or biking. Work with your health care provider or dietitian to adjust your eating plan to your individual calorie needs. What foods should I eat? Fruits All fresh, dried, or frozen fruit. Canned fruit in natural juice (without added sugar). Vegetables Fresh or frozen vegetables (raw, steamed, roasted, or grilled). Low-sodium or reduced-sodium tomato and vegetable juice. Low-sodium or reduced-sodium tomato sauce and tomato paste.  Low-sodium or reduced-sodium canned vegetables. Grains Whole-grain or whole-wheat bread. Whole-grain or whole-wheat pasta. Brown rice. [place]. Bulgur. Whole-grain and low-sodium cereals. Pita bread. Low-fat, low-sodium crackers. Whole-wheat flour tortillas. Meats and other proteins Skinless chicken or [place]. Ground chicken or [place]. Pork with fat trimmed off. Fish and seafood. Egg whites. Dried beans, peas, or lentils. Unsalted nuts, nut butters, and seeds. Unsalted canned beans. Lean cuts of beef with fat trimmed off. Low-sodium, lean precooked or cured meat, such as sausages or meat loaves. Dairy Low-fat (1%) or fat-free (skim) milk. Reduced-fat, low-fat, or fat-free cheeses. Nonfat, low-sodium ricotta or cottage cheese. Low-fat or nonfat yogurt. Low-fat, low-sodium cheese. Fats and oils Soft margarine without trans fats. Vegetable oil. Reduced-fat, low-fat, or light mayonnaise and salad dressings (reduced-sodium). Canola, safflower, olive, avocado, soybean, and sunflower oils. Avocado. Seasonings and condiments Herbs. Spices. Seasoning mixes without salt. Other foods Unsalted popcorn and pretzels. Fat-free sweets. The items listed above may not be a complete list of foods and beverages you can eat. Contact a dietitian for more information. What foods should I avoid? Fruits Canned fruit in a light or heavy syrup. Fried fruit. Fruit in cream or butter sauce. Vegetables Creamed or fried vegetables. Vegetables in a cheese sauce. Regular canned vegetables (not low-sodium or reduced-sodium). Regular canned tomato sauce and paste (not low-sodium or reduced-sodium). Regular tomato and vegetable juice (not low-sodium or reduced-sodium). Angie Fava. Olives. Grains Baked goods made with fat, such as croissants, muffins, or some breads. Dry pasta or rice meal packs. Meats and other proteins Fatty cuts of meat. Ribs. Fried meat. Berniece Salines. Bologna, salami, and other precooked or cured meats, such as  sausages or meat loaves. Fat from the back of a pig (fatback). Bratwurst. Salted nuts and seeds. Canned beans with added salt. Canned or smoked fish. Whole eggs or egg yolks. Chicken or [place] with skin. Dairy Whole or 2% milk, cream, and half-and-half. Whole or full-fat cream cheese. Whole-fat or sweetened yogurt. Full-fat cheese. Nondairy creamers. Whipped toppings. Processed cheese and cheese spreads. Fats and oils Butter. Stick margarine. Lard. Shortening. Ghee. Bacon fat. Tropical oils, such as coconut, palm kernel, or palm oil. Seasonings and condiments Onion salt, garlic salt, seasoned salt, table salt, and sea salt. Worcestershire sauce. Tartar sauce. Barbecue sauce. Teriyaki sauce. Soy sauce, including reduced-sodium. Steak sauce. Canned and packaged gravies. Fish sauce. Oyster sauce. Cocktail sauce. Store-bought horseradish. Ketchup. Mustard. Meat flavorings and tenderizers. Bouillon cubes. Hot sauces. Pre-made or packaged marinades. Pre-made or packaged taco seasonings. Relishes. Regular salad dressings. Other foods Salted popcorn and pretzels. The items listed above may not be a complete list of foods and beverages you should avoid. Contact a dietitian for more information. Where to find more information National Heart, Lung, and Blood Institute: https://wilson-eaton.com/ American Heart Association: www.heart.org Academy of Nutrition and Dietetics: www.eatright.[place]: www.kidney.org Summary The DASH eating plan is a healthy eating plan that has been shown to reduce high blood pressure (hypertension). It may also reduce your risk for type 2 diabetes,  heart disease, and stroke. When on the DASH eating plan, aim to eat more fresh fruits and vegetables, whole grains, lean proteins, low-fat dairy, and heart-healthy fats. With the DASH eating plan, you should limit salt (sodium) intake to 2,300 mg a day. If you have hypertension, you may need to reduce your sodium intake to  1,500 mg a day. Work with your health care provider or dietitian to adjust your eating plan to your individual calorie needs. This information is not intended to replace advice given to you by your health care provider. Make sure you discuss any questions you have with your health care provider. Document Revised: 06/27/2019 Document Reviewed: 06/27/2019 Elsevier Patient Education  2022 [employer].

## 2021-10-14 ENCOUNTER — Ambulatory Visit: Payer: Self-pay

## 2021-10-14 ENCOUNTER — Other Ambulatory Visit: Payer: Self-pay

## 2021-10-14 LAB — CBC
Hematocrit: 47.9 % (ref 37.5–51.0)
Hemoglobin: 16.1 g/dL (ref 13.0–17.7)
MCH: 30.6 pg (ref 26.6–33.0)
MCHC: 33.6 g/dL (ref 31.5–35.7)
MCV: 91 fL (ref 79–97)
Platelets: 274 10*3/uL (ref 150–450)
RBC: 5.27 x10E6/uL (ref 4.14–5.80)
RDW: 13.3 % (ref 11.6–15.4)
WBC: 5.8 10*3/uL (ref 3.4–10.8)

## 2021-10-14 LAB — COMPREHENSIVE METABOLIC PANEL
ALT: 33 IU/L (ref 0–44)
AST: 21 IU/L (ref 0–40)
Albumin/Globulin Ratio: 1.6 (ref 1.2–2.2)
Albumin: 5.6 g/dL — ABNORMAL HIGH (ref 4.0–5.0)
Alkaline Phosphatase: 97 IU/L (ref 44–121)
BUN/Creatinine Ratio: 8 — ABNORMAL LOW (ref 9–20)
BUN: 9 mg/dL (ref 6–24)
Bilirubin Total: 0.9 mg/dL (ref 0.0–1.2)
CO2: 22 mmol/L (ref 20–29)
Calcium: 10.4 mg/dL — ABNORMAL HIGH (ref 8.7–10.2)
Chloride: 100 mmol/L (ref 96–106)
Creatinine, Ser: 1.19 mg/dL (ref 0.76–1.27)
Globulin, Total: 3.4 g/dL (ref 1.5–4.5)
Glucose: 85 mg/dL (ref 70–99)
Potassium: 4.2 mmol/L (ref 3.5–5.2)
Sodium: 142 mmol/L (ref 134–144)
Total Protein: 9 g/dL — ABNORMAL HIGH (ref 6.0–8.5)
eGFR: 76 mL/min/{1.73_m2} (ref >59)

## 2021-10-14 LAB — LIPID PANEL
Chol/HDL Ratio: 3.4 ratio (ref 0.0–5.0)
Cholesterol, Total: 210 mg/dL — ABNORMAL HIGH (ref 100–199)
HDL: 61 mg/dL (ref >39)
LDL Chol Calc (NIH): 125 mg/dL — ABNORMAL HIGH (ref 0–99)
Triglycerides: 135 mg/dL (ref 0–149)
VLDL Cholesterol Cal: 24 mg/dL (ref 5–40)

## 2021-10-14 NOTE — Telephone Encounter (Signed)
Pt called advised to he is to take medication 0.5 (12.67m) once daily. Pt states he breaks tablet in half and takes the other half the next day.  ? ?Summary: med ?  ? Pt called in for directions of how to take chlorthalidone (HYGROTON) 25 MG tablet. Please call back   ?  ? ? ?Reason for Disposition ? Caller has medicine question only, adult not sick, AND triager answers question ? ?Answer Assessment - Initial Assessment Questions ?1. NAME of MEDICATION: "What medicine are you calling about?" ?    Hygroton ?2. QUESTION: "What is your question?" (e.g., double dose of medicine, side effect) ?    Wanted to know how to take it ?3. PRESCRIBING HCP: "Who prescribed it?" Reason: if prescribed by specialist, call should be referred to that group. ?    ZArmy Melia NP ? ?Protocols used: Medication Question Call-A-AH ? ?

## 2021-10-19 ENCOUNTER — Ambulatory Visit: Payer: Managed Care, Other (non HMO) | Admitting: Nurse Practitioner

## 2021-10-25 ENCOUNTER — Telehealth: Payer: Self-pay | Admitting: Pharmacist

## 2021-10-25 DIAGNOSIS — I10 Essential (primary) hypertension: Secondary | ICD-10-CM

## 2021-10-25 NOTE — Patient Outreach (Signed)
Patient appearing on report for [place] Metric Hypertension Control due to last documented ambulatory blood pressure of 220/139 on  10/13/21. Next appointment with PCP is not scheduled - was No Showed on 3/15. He reports that he thought this was rescheduled for 3/24, though there is nothing scheduled right now.  ? ?Outreached patient to discuss hypertension control and medication management.  ? ?Reports he has been taking amlodipine 10 mg, but has also been taking chlorthalidone 25 mg - reports someone called him after the appointment and told him to take the whole tablet daily. He has not been checking BP at home as he does not have a cuff. He requests that a blood pressure cuff script be sent to the pharmacy, in case his insurance covers it.  ? ?We also discussed tobacco cessation - he had been smoking 3-4 cigarettes daily, and is now using nicotine 14 mg patch daily. Denies any tachycardia, palpitations, trouble sleeping. Given baseline nicotine content, recommend reducing to 7 mg with next fill. He notes his insurance may have paid some for the patches, so requests a script for the 7 mg patch be sent to the pharmacy.  ? ?Medications Reviewed Today   ? ? Reviewed by De Hollingshead, RPH-CPP (Pharmacist) on 10/25/21 at 1142  Med List Status: <None>  ? ?Medication Order Taking? Sig Documenting Provider Last Dose Status Informant  ?amLODipine (NORVASC) 10 MG tablet [id number] Yes Take 1 tablet (10 mg total) by mouth daily. Ladell Pier, MD Taking Active   ?chlorthalidone (HYGROTON) 25 MG tablet [id number] Yes Take 0.5 tablets (12.5 mg total) by mouth daily. Ladell Pier, MD Taking Active   ?         ?Med Note Darnelle Maffucci, [place]   Tue Oct 25, 2021 11:41 AM) Taking whole tablet  ?nicotine (NICODERM CQ - DOSED IN MG/24 HOURS) 14 mg/24hr patch [id number] Yes Place 1 patch (14 mg total) onto the skin daily. Ladell Pier, MD Taking Active   ? ?  ?  ? ?  ? ?Encouraged patient to call the office to  reschedule his follow up with PCP. Will route note to PCP and embedded PharmD for follow up.  ? ?Catie Darnelle Maffucci, PharmD, BCACP ?[place] ?[phone] ? ? ?

## 2021-10-25 NOTE — Patient Outreach (Signed)
Called patient. Reviewed appointment on 3/24 at 11:10 am per Geryl Rankins. Patient verbalized understanding.  ?

## 2021-10-28 ENCOUNTER — Other Ambulatory Visit: Payer: Self-pay

## 2021-10-28 ENCOUNTER — Ambulatory Visit: Payer: Managed Care, Other (non HMO) | Attending: Nurse Practitioner

## 2021-10-28 ENCOUNTER — Encounter: Payer: Self-pay | Admitting: Nurse Practitioner

## 2021-10-28 ENCOUNTER — Other Ambulatory Visit: Payer: Self-pay | Admitting: Nurse Practitioner

## 2021-10-28 ENCOUNTER — Encounter: Payer: Self-pay | Admitting: Internal Medicine

## 2021-10-28 VITALS — BP 151/98 | HR 90 | Resp 18 | Ht 72.0 in | Wt 197.0 lb

## 2021-10-28 DIAGNOSIS — I10 Essential (primary) hypertension: Secondary | ICD-10-CM

## 2021-10-28 MED ORDER — VALSARTAN 80 MG PO TABS: 80.0000 mg | ORAL_TABLET | Freq: Every day | ORAL | 0 refills | Status: DC

## 2021-10-28 MED ORDER — BLOOD PRESSURE MONITOR KIT: PACK | 0 refills | Status: DC

## 2021-10-28 MED ORDER — BLOOD PRESSURE MONITOR KIT
PACK | 0 refills | Status: DC
  Filled 2021-10-28: qty 1, fill #0

## 2021-10-28 NOTE — Telephone Encounter (Signed)
S/W patient over phone. He is aware to stop chlorthalidone. Pick up valsartan and continue with amlodipine ?

## 2021-11-03 ENCOUNTER — Encounter: Payer: Self-pay | Admitting: Internal Medicine

## 2021-11-03 ENCOUNTER — Encounter: Payer: Self-pay | Admitting: Nurse Practitioner

## 2021-11-04 ENCOUNTER — Other Ambulatory Visit: Payer: Self-pay | Admitting: Nurse Practitioner

## 2021-11-04 ENCOUNTER — Encounter: Payer: Self-pay | Admitting: Nurse Practitioner

## 2021-11-04 DIAGNOSIS — I10 Essential (primary) hypertension: Secondary | ICD-10-CM

## 2021-11-04 MED ORDER — BLOOD PRESSURE MONITOR KIT: PACK | 0 refills | Status: DC

## 2021-11-04 NOTE — Progress Notes (Signed)
Patient blood pressure today in office 135/88 HR 81 Sats 100%. ?

## 2021-11-08 ENCOUNTER — Encounter: Payer: Self-pay | Admitting: Nurse Practitioner

## 2021-11-15 ENCOUNTER — Other Ambulatory Visit: Payer: Self-pay

## 2021-11-16 ENCOUNTER — Other Ambulatory Visit: Payer: Self-pay

## 2021-11-25 ENCOUNTER — Encounter: Payer: Self-pay | Admitting: Pharmacist

## 2021-11-25 ENCOUNTER — Ambulatory Visit: Payer: Managed Care, Other (non HMO) | Attending: Nurse Practitioner | Admitting: Pharmacist

## 2021-11-25 VITALS — BP 106/78

## 2021-11-25 DIAGNOSIS — I10 Essential (primary) hypertension: Secondary | ICD-10-CM | POA: Diagnosis not present

## 2021-11-25 NOTE — Progress Notes (Signed)
? ?  S:    ? ?No chief complaint on file. ? ? ?Paul Molina is a 46 y.o. male who presents for hypertension evaluation, education, and management. PMH is significant for HTN, TMJ, tobacco use. Patient was referred and last seen by Dr. Wynetta Emery on 10/13/2020. His BP was very high at that visit, however, he had been without amlodipine for some time. His amlodipine was refilled. He has valsartan and chlorthalidone listed on his profile but he is not taking the chlorthalidone. He is taking valsartan.  ? ?Today, patient arrives in good spirits and presents without assistance. Denies dizziness, headache, blurred vision, swelling.  ? ?Patient reports hypertension is longstanding.  ? ?Family/Social history:  ?Fhx: HTN, stroke ?Tobacco: current 0.25 PPD smoker  ?Alcohol: none reported ? ?Medication adherence reported . Patient has taken BP medications today.  ? ?Current antihypertensives include: amlodipine 10 mg daily, valsartan 80 mg daily  ? ?Reported home BP readings:  ?-150s/90s ? ?Patient reported dietary habits:  ?-Reports that he has been cutting back on sodium in his diet  ?-Does not drink caffeine  ? ?Patient-reported exercise habits:  ?-None outside of work  ? ?O:  ?Vitals:  ? 11/25/21 1605  ?BP: 106/78  ? ?Last 3 Office BP readings: ?BP Readings from Last 3 Encounters:  ?11/25/21 106/78  ?10/28/21 (!) 151/98  ?10/13/21 (!) 220/139  ? ? ?BMET ?   ?Component Value Date/Time  ? NA 142 10/13/2021 1155  ? K 4.2 10/13/2021 1155  ? CL 100 10/13/2021 1155  ? CO2 22 10/13/2021 1155  ? GLUCOSE 85 10/13/2021 1155  ? GLUCOSE 115 (H) 10/01/2019 1610  ? BUN 9 10/13/2021 1155  ? CREATININE 1.19 10/13/2021 1155  ? CALCIUM 10.4 (H) 10/13/2021 1155  ? GFRNONAA >60 10/01/2019 1610  ? GFRAA >60 10/01/2019 1610  ? ? ?Renal function: ?CrCl cannot be calculated (Patient's most recent lab result is older than the maximum 21 days allowed.). ? ?Clinical ASCVD: No  ?The 10-year ASCVD risk score (Arnett DK, et al., 2019) is: 8.2% ?  Values  used to calculate the score: ?    Age: 78 years ?    Sex: Male ?    Is Non-Hispanic African American: Yes ?    Diabetic: No ?    Tobacco smoker: Yes ?    Systolic Blood Pressure: 324 mmHg ?    Is BP treated: Yes ?    HDL Cholesterol: 61 mg/dL ?    Total Cholesterol: 210 mg/dL ? ?A/P: ?Hypertension longstanding currently at goal on current medications. BP goal < 130/80 mmHg. Medication adherence appears appropriate.   ?-Continued current regimen.  ?-F/u labs ordered - BMP anticipated at follow-up. ?-Counseled on lifestyle modifications for blood pressure control including reduced dietary sodium, increased exercise, adequate sleep. ?-Encouraged patient to check BP at home and bring log of readings to next visit. Counseled on proper use of home BP cuff.  ? ?Results reviewed and written information provided. Patient verbalized understanding of treatment plan. Total time in face-to-face counseling 20 minutes.  ? ?F/u clinic visit in 1 month. ? ?Benard Halsted, PharmD, BCACP, CPP ?Clinical Pharmacist ?[place] ?[phone] ? ? ?

## 2021-11-30 ENCOUNTER — Other Ambulatory Visit: Payer: Self-pay | Admitting: Nurse Practitioner

## 2021-11-30 ENCOUNTER — Other Ambulatory Visit: Payer: Self-pay

## 2021-11-30 MED ORDER — VALSARTAN 80 MG PO TABS
80.0000 mg | ORAL_TABLET | Freq: Every day | ORAL | 0 refills | Status: DC
  Filled 2021-11-30: qty 30, 30d supply, fill #0
  Filled 2021-12-29: qty 30, 30d supply, fill #1
  Filled 2022-01-31: qty 30, 30d supply, fill #0
  Filled 2022-01-31 (×2): qty 30, 30d supply, fill #2
  Filled 2022-02-01 (×2): qty 30, 30d supply, fill #0

## 2021-12-16 ENCOUNTER — Other Ambulatory Visit: Payer: Self-pay

## 2021-12-27 ENCOUNTER — Encounter: Payer: Self-pay | Admitting: Pharmacist

## 2021-12-27 ENCOUNTER — Ambulatory Visit: Payer: Commercial Managed Care - HMO | Admitting: Pharmacist

## 2021-12-27 ENCOUNTER — Ambulatory Visit: Payer: Commercial Managed Care - HMO | Attending: Nurse Practitioner | Admitting: Pharmacist

## 2021-12-27 ENCOUNTER — Other Ambulatory Visit: Payer: Self-pay

## 2021-12-27 DIAGNOSIS — I10 Essential (primary) hypertension: Secondary | ICD-10-CM

## 2021-12-27 MED ORDER — CHLORTHALIDONE 25 MG PO TABS
25.0000 mg | ORAL_TABLET | Freq: Every day | ORAL | 3 refills | Status: DC
  Filled 2021-12-27: qty 30, 30d supply, fill #0
  Filled 2022-01-31 (×2): qty 30, 30d supply, fill #1
  Filled 2022-01-31: qty 30, 30d supply, fill #0

## 2021-12-27 NOTE — Progress Notes (Signed)
S:     No chief complaint on file.   Paul Molina is a 46 y.o. male who presents for hypertension evaluation, education, and management. PMH is significant for HTN, TMJ, tobacco use. Patient was referred and last seen by Dr. Wynetta Emery on 10/13/2020. I saw him last month and his BP was good after having been elevated for several clinic visits. I made no med changes and asked him to come back today to verify.   Today, patient arrives in good spirits and presents without assistance. Denies dizziness, headache, blurred vision, swelling.   Patient reports hypertension is longstanding.   Family/Social history:  Fhx: HTN, stroke Tobacco: current 0.25 PPD smoker  Alcohol: none reported  Medication adherence reported . Patient has taken BP medications today.   Current antihypertensives include: amlodipine 10 mg daily, valsartan 80 mg daily   Reported home BP readings:  -Endorses a range of 130s-150s/90s  Patient reported dietary habits:  -Reports that he has been cutting back on sodium in his diet  -Does not drink caffeine   Patient-reported exercise habits:  -None outside of work   O:  Vitals:   12/27/21 1355  BP: (!) 158/98  Pulse: 78   Last 3 Office BP readings: BP Readings from Last 3 Encounters:  12/27/21 (!) 158/98  11/25/21 106/78  10/28/21 (!) 151/98    BMET    Component Value Date/Time   NA 142 10/13/2021 1155   K 4.2 10/13/2021 1155   CL 100 10/13/2021 1155   CO2 22 10/13/2021 1155   GLUCOSE 85 10/13/2021 1155   GLUCOSE 115 (H) 10/01/2019 1610   BUN 9 10/13/2021 1155   CREATININE 1.19 10/13/2021 1155   CALCIUM 10.4 (H) 10/13/2021 1155   GFRNONAA >60 10/01/2019 1610   GFRAA >60 10/01/2019 1610    Renal function: CrCl cannot be calculated (Patient's most recent lab result is older than the maximum 21 days allowed.).  Clinical ASCVD: No  The 10-year ASCVD risk score (Arnett DK, et al., 2019) is: 16.9%   Values used to calculate the score:     Age: 8  years     Sex: Male     Is Non-Hispanic African American: Yes     Diabetic: No     Tobacco smoker: Yes     Systolic Blood Pressure: 789 mmHg     Is BP treated: Yes     HDL Cholesterol: 61 mg/dL     Total Cholesterol: 210 mg/dL  A/P: Hypertension longstanding currently above goal on current medications. BP goal < 130/80 mmHg. Medication adherence appears appropriate. His BP today is more on line with home pressures and clinic BP from the past. I recommend to add back chlorthalidone and have him return in 1 month for labs and BP recheck. He had a mild hypercalcemia at last time of lab check. We will need to repeat labs at follow-up to make sure the thiaizde does not worsen this.   -Continued amlodipine, valsartan at current doses.  -Start chlorthalidone 25 mg daily.   -F/u labs ordered - BMP anticipated at follow-up. -Counseled on lifestyle modifications for blood pressure control including reduced dietary sodium, increased exercise, adequate sleep. -Encouraged patient to check BP at home and bring log of readings to next visit. Counseled on proper use of home BP cuff.   Results reviewed and written information provided. Patient verbalized understanding of treatment plan. Total time in face-to-face counseling 20 minutes.   F/u clinic visit in 1 month.  Acquanetta Belling  Patsey Berthold, PharmD, [place], [place] [phone]

## 2021-12-29 ENCOUNTER — Other Ambulatory Visit: Payer: Self-pay

## 2021-12-30 ENCOUNTER — Other Ambulatory Visit: Payer: Self-pay

## 2022-01-04 LAB — COLOGUARD: COLOGUARD: NEGATIVE

## 2022-01-16 ENCOUNTER — Encounter: Payer: Self-pay | Admitting: Nurse Practitioner

## 2022-01-17 ENCOUNTER — Other Ambulatory Visit: Payer: Self-pay

## 2022-01-31 ENCOUNTER — Other Ambulatory Visit: Payer: Self-pay

## 2022-01-31 ENCOUNTER — Other Ambulatory Visit: Payer: Self-pay | Admitting: Nurse Practitioner

## 2022-01-31 ENCOUNTER — Other Ambulatory Visit: Payer: Self-pay | Admitting: Family Medicine

## 2022-01-31 ENCOUNTER — Other Ambulatory Visit (HOSPITAL_COMMUNITY): Payer: Self-pay

## 2022-01-31 DIAGNOSIS — I10 Essential (primary) hypertension: Secondary | ICD-10-CM

## 2022-01-31 MED ORDER — BLOOD PRESSURE MONITOR MISC
0 refills | Status: DC
  Filled 2022-01-31: qty 1, 90d supply, fill #0

## 2022-01-31 MED ORDER — BLOOD PRESSURE MONITOR KIT
PACK | 0 refills | Status: DC
  Filled 2022-01-31: qty 1, fill #0

## 2022-01-31 MED ORDER — CHLORTHALIDONE 25 MG PO TABS
25.0000 mg | ORAL_TABLET | Freq: Every day | ORAL | 3 refills | Status: DC
  Filled 2022-01-31: qty 30, 30d supply, fill #0

## 2022-02-01 ENCOUNTER — Other Ambulatory Visit: Payer: Self-pay

## 2022-02-01 ENCOUNTER — Other Ambulatory Visit (HOSPITAL_COMMUNITY): Payer: Self-pay

## 2022-02-01 ENCOUNTER — Other Ambulatory Visit: Payer: Self-pay | Admitting: Pharmacist

## 2022-02-01 DIAGNOSIS — I10 Essential (primary) hypertension: Secondary | ICD-10-CM

## 2022-02-01 MED ORDER — CHLORTHALIDONE 25 MG PO TABS
25.0000 mg | ORAL_TABLET | Freq: Every day | ORAL | 0 refills | Status: DC
  Filled 2022-02-01: qty 90, 90d supply, fill #0
  Filled 2022-02-01: qty 30, 30d supply, fill #0
  Filled 2022-03-10: qty 30, 30d supply, fill #1
  Filled 2022-04-16: qty 30, 30d supply, fill #2

## 2022-02-02 ENCOUNTER — Ambulatory Visit: Payer: Commercial Managed Care - HMO | Attending: Nurse Practitioner | Admitting: Pharmacist

## 2022-02-02 ENCOUNTER — Encounter: Payer: Self-pay | Admitting: Pharmacist

## 2022-02-02 ENCOUNTER — Other Ambulatory Visit: Payer: Self-pay

## 2022-02-02 VITALS — BP 138/97

## 2022-02-02 DIAGNOSIS — I10 Essential (primary) hypertension: Secondary | ICD-10-CM

## 2022-02-02 NOTE — Progress Notes (Signed)
   S:     No chief complaint on file.   Paul Molina is a 46 y.o. male who presents for hypertension evaluation, education, and management. PMH is significant for HTN, TMJ, tobacco use. Patient was referred and last seen by Dr. Wynetta Emery on 10/13/2020. I saw him last month and started chlorthalidone.   Today, patient arrives in good spirits and presents without assistance. Denies dizziness, headache, blurred vision, swelling.   Patient reports hypertension is longstanding.   Family/Social history:  Fhx: HTN, stroke Tobacco: current 0.25 PPD smoker  Alcohol: none reported  Medication adherence reported . Patient has taken BP medications today. Of note, he did run out of the chlorthalidone ~2 days ago but got a refill today before coming upstairs to this appointment.   Current antihypertensives include: amlodipine 10 mg daily, chlorthalidone 25 mg daily, valsartan 80 mg daily   Reported home BP readings:  -Endorses improvement in home BP since adding chlorthalidone  - Gives range 130s/80s  Patient reported dietary habits:  -Reports that he has been cutting back on sodium in his diet  -Does not drink caffeine   Patient-reported exercise habits:  -None outside of work   O:  Vitals:   02/02/22 1352  BP: (!) 138/97    Last 3 Office BP readings: BP Readings from Last 3 Encounters:  02/02/22 (!) 138/97  12/27/21 (!) 158/98  11/25/21 106/78    BMET    Component Value Date/Time   NA 142 10/13/2021 1155   K 4.2 10/13/2021 1155   CL 100 10/13/2021 1155   CO2 22 10/13/2021 1155   GLUCOSE 85 10/13/2021 1155   GLUCOSE 115 (H) 10/01/2019 1610   BUN 9 10/13/2021 1155   CREATININE 1.19 10/13/2021 1155   CALCIUM 10.4 (H) 10/13/2021 1155   GFRNONAA >60 10/01/2019 1610   GFRAA >60 10/01/2019 1610    Renal function: CrCl cannot be calculated (Patient's most recent lab result is older than the maximum 21 days allowed.).  Clinical ASCVD: No  The 10-year ASCVD risk score (Arnett  DK, et al., 2019) is: 13.3%   Values used to calculate the score:     Age: 41 years     Sex: Male     Is Non-Hispanic African American: Yes     Diabetic: No     Tobacco smoker: Yes     Systolic Blood Pressure: 588 mmHg     Is BP treated: Yes     HDL Cholesterol: 61 mg/dL     Total Cholesterol: 210 mg/dL  A/P: Hypertension longstanding currently above goal on current medications. BP goal < 130/80 mmHg. Medication adherence appears appropriate but he was out of chlorthalidone ~2 days before his visit today and has not taken it today. His BP today is improved vs last month.  -Continued amlodipine, valsartan, and chlorthalidone at current doses.  -F/u labs ordered - CMP14+eGFR. -Counseled on lifestyle modifications for blood pressure control including reduced dietary sodium, increased exercise, adequate sleep. -Encouraged patient to check BP at home and bring log of readings to next visit. Counseled on proper use of home BP cuff.  -Encouraged adherence to medications.  [name]  Results reviewed and written information provided. Patient verbalized understanding of treatment plan. Total time in face-to-face counseling 20 minutes.   F/u clinic visit in 1 month.  Benard Halsted, PharmD, [place], [place] [phone]

## 2022-02-03 LAB — CMP14+EGFR
ALT: 37 IU/L (ref 0–44)
AST: 29 IU/L (ref 0–40)
Albumin/Globulin Ratio: 1.5 (ref 1.2–2.2)
Albumin: 4.8 g/dL (ref 4.0–5.0)
Alkaline Phosphatase: 80 IU/L (ref 44–121)
BUN/Creatinine Ratio: 11 (ref 9–20)
BUN: 13 mg/dL (ref 6–24)
Bilirubin Total: 0.4 mg/dL (ref 0.0–1.2)
CO2: 25 mmol/L (ref 20–29)
Calcium: 10 mg/dL (ref 8.7–10.2)
Chloride: 96 mmol/L (ref 96–106)
Creatinine, Ser: 1.19 mg/dL (ref 0.76–1.27)
Globulin, Total: 3.1 g/dL (ref 1.5–4.5)
Glucose: 99 mg/dL (ref 70–99)
Potassium: 3.9 mmol/L (ref 3.5–5.2)
Sodium: 141 mmol/L (ref 134–144)
Total Protein: 7.9 g/dL (ref 6.0–8.5)
eGFR: 76 mL/min/{1.73_m2} (ref >59)

## 2022-02-20 ENCOUNTER — Other Ambulatory Visit: Payer: Self-pay

## 2022-02-24 ENCOUNTER — Other Ambulatory Visit: Payer: Self-pay

## 2022-03-10 ENCOUNTER — Other Ambulatory Visit: Payer: Self-pay

## 2022-03-10 ENCOUNTER — Ambulatory Visit: Payer: Commercial Managed Care - HMO | Attending: Nurse Practitioner | Admitting: Nurse Practitioner

## 2022-03-10 ENCOUNTER — Encounter: Payer: Self-pay | Admitting: Nurse Practitioner

## 2022-03-10 ENCOUNTER — Other Ambulatory Visit: Payer: Self-pay | Admitting: Physician Assistant

## 2022-03-10 VITALS — BP 131/89 | HR 87 | Temp 98.2°F | Ht 72.0 in | Wt 198.4 lb

## 2022-03-10 DIAGNOSIS — I10 Essential (primary) hypertension: Secondary | ICD-10-CM | POA: Diagnosis not present

## 2022-03-10 DIAGNOSIS — Z1159 Encounter for screening for other viral diseases: Secondary | ICD-10-CM

## 2022-03-10 MED ORDER — AMLODIPINE BESYLATE 10 MG PO TABS
10.0000 mg | ORAL_TABLET | Freq: Every day | ORAL | 1 refills | Status: DC
  Filled 2022-03-10: qty 90, 90d supply, fill #0
  Filled 2022-03-30: qty 30, 30d supply, fill #0
  Filled 2022-05-02: qty 30, 30d supply, fill #1
  Filled 2022-05-28 – 2022-06-06 (×2): qty 30, 30d supply, fill #2
  Filled 2022-08-15: qty 30, 30d supply, fill #3
  Filled 2022-09-18 – 2022-10-11 (×2): qty 30, 30d supply, fill #4
  Filled 2022-11-19: qty 30, 30d supply, fill #5

## 2022-03-10 MED ORDER — VALSARTAN 80 MG PO TABS
80.0000 mg | ORAL_TABLET | Freq: Every day | ORAL | 0 refills | Status: DC
  Filled 2022-03-10: qty 30, 30d supply, fill #0
  Filled 2022-04-16: qty 30, 30d supply, fill #1
  Filled 2022-05-18: qty 30, 30d supply, fill #2

## 2022-03-10 NOTE — Telephone Encounter (Signed)
Requested Prescriptions  Pending Prescriptions Disp Refills  . valsartan (DIOVAN) 80 MG tablet 90 tablet 0    Sig: Take 1 tablet (80 mg total) by mouth daily.     Cardiovascular:  Angiotensin Receptor Blockers Failed - 03/10/2022 10:36 AM      Failed - Last BP in normal range    BP Readings from Last 1 Encounters:  02/02/22 (!) 138/97         Passed - Cr in normal range and within 180 days    Creatinine, Ser  Date Value Ref Range Status  02/02/2022 1.19 0.76 - 1.27 mg/dL Final         Passed - K in normal range and within 180 days    Potassium  Date Value Ref Range Status  02/02/2022 3.9 3.5 - 5.2 mmol/L Final         Passed - Patient is not pregnant      Passed - Valid encounter within last 6 months    Recent Outpatient Visits          1 month ago Essential hypertension   [facility] [place], Cornelius Moras, RPH-CPP   2 months ago Essential hypertension   [facility] Lois Huxley, Cornelius Moras, RPH-CPP   3 months ago Essential hypertension   [facility] Lois Huxley, Cornelius Moras, RPH-CPP   4 months ago Physical exam, annual   [place] [facility] Marcine Matar, MD   2 years ago Encounter to establish care   [facility] [place], Shea Stakes, NP      Future Appointments            Today Claiborne Rigg, NP [facility]

## 2022-03-10 NOTE — Progress Notes (Signed)
Assessment & Plan:  Vanderbilt was seen today for hypertension.  Diagnoses and all orders for this visit:  Primary hypertension Continue all antihypertensives as prescribed.  Reminded to bring in blood pressure log for follow  up appointment.  RECOMMENDATIONS: DASH/Mediterranean Diets are healthier choices for HTN.    Need for hepatitis C screening test -     HCV Ab w Reflex to Quant PCR    Patient has been counseled on age-appropriate routine health concerns for screening and prevention. These are reviewed and up-to-date. Referrals have been placed accordingly. Immunizations are up-to-date or declined.    Subjective:   Chief Complaint  Patient presents with   Hypertension   Hypertension Pertinent negatives include no blurred vision, chest pain, headaches, malaise/fatigue, palpitations or shortness of breath.   Paul Molina 46 y.o. male presents to office today for follow up to HTN.   He has a past medical history of Anxiety and Hypertension.   Endorses tachycardia 100-110s when he goes to the plasma center to try to give blood. He has not been able to donate lately due to his tachycardia. Notes normal HR at home and office visit heart rate levels have been normal as well.   Currently has not been taking valsartan 80 mg daily or chlorthalidone 25 mg daily due to cost. Only taking amlodipine 10 mg daily. I have instructed him to speak with the pharmacist regarding the patient assistance program.  BP Readings from Last 3 Encounters:  03/10/22 131/89  02/02/22 (!) 138/97  12/27/21 (!) 158/98    Review of Systems  Constitutional:  Negative for fever, malaise/fatigue and weight loss.  HENT: Negative.  Negative for nosebleeds.   Eyes: Negative.  Negative for blurred vision, double vision and photophobia.  Respiratory: Negative.  Negative for cough and shortness of breath.   Cardiovascular: Negative.  Negative for chest pain, palpitations and leg swelling.  Gastrointestinal:  Negative.  Negative for heartburn, nausea and vomiting.  Musculoskeletal: Negative.  Negative for myalgias.  Neurological: Negative.  Negative for dizziness, focal weakness, seizures and headaches.  Psychiatric/Behavioral: Negative.  Negative for suicidal ideas.     Past Medical History:  Diagnosis Date   Anxiety    Hypertension     Past Surgical History:  Procedure Laterality Date   NO PAST SURGERIES      Family History  Problem Relation Age of Onset   Hypertension Mother    Hypertension Father    Stroke Father     Social History Reviewed with no changes to be made today.   Outpatient Medications Prior to Visit  Medication Sig Dispense Refill   amLODipine (NORVASC) 10 MG tablet Take 1 tablet (10 mg total) by mouth daily. 30 tablet 4   chlorthalidone (HYGROTON) 25 MG tablet Take 1 tablet by mouth daily. 90 tablet 0   nicotine (NICODERM CQ - DOSED IN MG/24 HOURS) 14 mg/24hr patch Place 1 patch (14 mg total) onto the skin daily. 28 patch 2   valsartan (DIOVAN) 80 MG tablet Take 1 tablet (80 mg total) by mouth daily. 90 tablet 0   Blood Pressure Monitor KIT Use to check blood pressure as directed 1 kit 0   Blood Pressure Monitor MISC Use as directed to check blood pressure 1 each 0   No facility-administered medications prior to visit.    No Known Allergies     Objective:    BP 131/89   Pulse 87   Temp 98.2 F (36.8 C) (Oral)   Ht 6' (  1.829 m)   Wt 198 lb 6.4 oz (90 kg)   SpO2 98%   BMI 26.91 kg/m  Wt Readings from Last 3 Encounters:  03/10/22 198 lb 6.4 oz (90 kg)  10/28/21 197 lb (89.4 kg)  10/13/21 197 lb 9.6 oz (89.6 kg)    Physical Exam Vitals and nursing note reviewed.  Constitutional:      Appearance: He is well-developed.  HENT:     Head: Normocephalic and atraumatic.  Cardiovascular:     Rate and Rhythm: Normal rate and regular rhythm.     Heart sounds: Normal heart sounds. No murmur heard.    No friction rub. No gallop.  Pulmonary:      Effort: Pulmonary effort is normal. No tachypnea or respiratory distress.     Breath sounds: Normal breath sounds. No decreased breath sounds, wheezing, rhonchi or rales.  Chest:     Chest wall: No tenderness.  Abdominal:     General: Bowel sounds are normal.     Palpations: Abdomen is soft.  Musculoskeletal:        General: Normal range of motion.     Cervical back: Normal range of motion.  Skin:    General: Skin is warm and dry.  Neurological:     Mental Status: He is alert and oriented to person, place, and time.     Coordination: Coordination normal.  Psychiatric:        Behavior: Behavior normal. Behavior is cooperative.        Thought Content: Thought content normal.        Judgment: Judgment normal.          Patient has been counseled extensively about nutrition and exercise as well as the importance of adherence with medications and regular follow-up. The patient was given clear instructions to go to ER or return to medical center if symptoms don't improve, worsen or new problems develop. The patient verbalized understanding.   Follow-up: Return in about 3 months (around 06/10/2022).   Gildardo Pounds, FNP-BC [facility] [place], [place]   03/10/2022, 3:15 PM

## 2022-03-11 LAB — HCV INTERPRETATION

## 2022-03-11 LAB — HCV AB W REFLEX TO QUANT PCR: HCV Ab: NONREACTIVE

## 2022-03-17 ENCOUNTER — Other Ambulatory Visit (HOSPITAL_COMMUNITY): Payer: Self-pay

## 2022-03-30 ENCOUNTER — Other Ambulatory Visit: Payer: Self-pay

## 2022-04-17 ENCOUNTER — Other Ambulatory Visit: Payer: Self-pay

## 2022-04-17 ENCOUNTER — Other Ambulatory Visit (HOSPITAL_COMMUNITY): Payer: Self-pay

## 2022-04-18 ENCOUNTER — Other Ambulatory Visit (HOSPITAL_COMMUNITY): Payer: Self-pay

## 2022-04-20 ENCOUNTER — Other Ambulatory Visit (HOSPITAL_COMMUNITY): Payer: Self-pay

## 2022-04-20 ENCOUNTER — Other Ambulatory Visit: Payer: Self-pay

## 2022-04-21 ENCOUNTER — Other Ambulatory Visit: Payer: Self-pay

## 2022-04-28 ENCOUNTER — Encounter: Payer: Self-pay | Admitting: Nurse Practitioner

## 2022-05-02 ENCOUNTER — Other Ambulatory Visit: Payer: Self-pay

## 2022-05-18 ENCOUNTER — Other Ambulatory Visit: Payer: Self-pay | Admitting: Family Medicine

## 2022-05-18 ENCOUNTER — Other Ambulatory Visit: Payer: Self-pay

## 2022-05-18 DIAGNOSIS — I10 Essential (primary) hypertension: Secondary | ICD-10-CM

## 2022-05-18 MED ORDER — CHLORTHALIDONE 25 MG PO TABS
25.0000 mg | ORAL_TABLET | Freq: Every day | ORAL | 1 refills | Status: DC
  Filled 2022-05-18: qty 30, 30d supply, fill #0
  Filled 2022-06-06 – 2022-06-09 (×4): qty 30, 30d supply, fill #1
  Filled 2022-08-15: qty 30, 30d supply, fill #2
  Filled 2022-09-18 – 2022-10-11 (×2): qty 30, 30d supply, fill #3
  Filled 2022-11-19: qty 30, 30d supply, fill #4
  Filled 2022-12-17: qty 30, 30d supply, fill #5

## 2022-05-19 ENCOUNTER — Other Ambulatory Visit: Payer: Self-pay

## 2022-05-29 ENCOUNTER — Other Ambulatory Visit: Payer: Self-pay

## 2022-06-05 ENCOUNTER — Other Ambulatory Visit: Payer: Self-pay

## 2022-06-06 ENCOUNTER — Other Ambulatory Visit: Payer: Self-pay | Admitting: Nurse Practitioner

## 2022-06-06 ENCOUNTER — Other Ambulatory Visit: Payer: Self-pay

## 2022-06-06 MED ORDER — VALSARTAN 80 MG PO TABS
80.0000 mg | ORAL_TABLET | Freq: Every day | ORAL | 0 refills | Status: DC
  Filled 2022-06-06 (×2): qty 90, 90d supply, fill #0
  Filled 2022-06-09 – 2022-06-12 (×4): qty 30, 30d supply, fill #0
  Filled 2022-08-15: qty 30, 30d supply, fill #1
  Filled 2022-09-18 – 2022-10-11 (×2): qty 30, 30d supply, fill #2

## 2022-06-09 ENCOUNTER — Other Ambulatory Visit: Payer: Self-pay

## 2022-06-10 ENCOUNTER — Other Ambulatory Visit: Payer: Self-pay

## 2022-06-12 ENCOUNTER — Other Ambulatory Visit: Payer: Self-pay

## 2022-08-15 ENCOUNTER — Other Ambulatory Visit: Payer: Self-pay

## 2022-08-17 ENCOUNTER — Other Ambulatory Visit: Payer: Self-pay

## 2022-09-18 ENCOUNTER — Other Ambulatory Visit: Payer: Self-pay

## 2022-09-19 ENCOUNTER — Other Ambulatory Visit: Payer: Self-pay

## 2022-09-20 ENCOUNTER — Other Ambulatory Visit: Payer: Self-pay

## 2022-09-25 ENCOUNTER — Other Ambulatory Visit: Payer: Self-pay

## 2022-10-11 ENCOUNTER — Other Ambulatory Visit: Payer: Self-pay

## 2022-10-18 ENCOUNTER — Encounter: Payer: Self-pay | Admitting: Nurse Practitioner

## 2022-11-19 ENCOUNTER — Other Ambulatory Visit: Payer: Self-pay | Admitting: Family Medicine

## 2022-11-20 ENCOUNTER — Other Ambulatory Visit: Payer: Self-pay

## 2022-12-17 ENCOUNTER — Other Ambulatory Visit: Payer: Self-pay | Admitting: Family Medicine

## 2022-12-17 ENCOUNTER — Other Ambulatory Visit: Payer: Self-pay | Admitting: Nurse Practitioner

## 2022-12-17 DIAGNOSIS — I10 Essential (primary) hypertension: Secondary | ICD-10-CM

## 2022-12-18 ENCOUNTER — Other Ambulatory Visit: Payer: Self-pay

## 2022-12-18 MED ORDER — AMLODIPINE BESYLATE 10 MG PO TABS
10.0000 mg | ORAL_TABLET | Freq: Every day | ORAL | 0 refills | Status: DC
  Filled 2022-12-18: qty 15, 15d supply, fill #0

## 2022-12-18 MED ORDER — VALSARTAN 80 MG PO TABS
80.0000 mg | ORAL_TABLET | Freq: Every day | ORAL | 0 refills | Status: DC
  Filled 2022-12-18: qty 30, 30d supply, fill #0

## 2022-12-18 NOTE — Telephone Encounter (Signed)
Requested medications are due for refill today.  yes  Requested medications are on the active medications list.  yes  Last refill. 06/06/2022 #90 1OX  Future visit scheduled.   yes  Notes to clinic.  Rx written to expire 11/10/2022 - Rx is expired.    Requested Prescriptions  Pending Prescriptions Disp Refills   valsartan (DIOVAN) 80 MG tablet 90 tablet 0    Sig: Take 1 tablet (80 mg total) by mouth daily.     Cardiovascular:  Angiotensin Receptor Blockers Failed - 12/17/2022 11:13 AM      Failed - Cr in normal range and within 180 days    Creatinine, Ser  Date Value Ref Range Status  02/02/2022 1.19 0.76 - 1.27 mg/dL Final         Failed - K in normal range and within 180 days    Potassium  Date Value Ref Range Status  02/02/2022 3.9 3.5 - 5.2 mmol/L Final         Failed - Valid encounter within last 6 months    Recent Outpatient Visits           9 months ago Primary hypertension   [place] [facility] [place], Shea Stakes, NP   10 months ago Essential hypertension   [facility] [place], Cornelius Moras, RPH-CPP   11 months ago Essential hypertension   [facility] [place], Cornelius Moras, RPH-CPP   1 year ago Essential hypertension   [place] [facility] Drucilla Chalet, RPH-CPP   1 year ago Physical exam, annual   [place] [facility] Marcine Matar, MD       Future Appointments             In 1 week Claiborne Rigg, NP [facility]            Passed - Patient is not pregnant      Passed - Last BP in normal range    BP Readings from Last 1 Encounters:  03/10/22 131/89

## 2022-12-18 NOTE — Telephone Encounter (Signed)
Courtesy refill. Patient must keep upcoming appointment for further refills. Requested Prescriptions  Pending Prescriptions Disp Refills   amLODipine (NORVASC) 10 MG tablet 15 tablet 0    Sig: Take 1 tablet (10 mg total) by mouth daily.     Cardiovascular: Calcium Channel Blockers 2 Failed - 12/17/2022 11:13 AM      Failed - Valid encounter within last 6 months    Recent Outpatient Visits           9 months ago Primary hypertension   [place] [facility] [place], Shea Stakes, NP   10 months ago Essential hypertension   [facility] [place], Cornelius Moras, RPH-CPP   11 months ago Essential hypertension   [facility] Drucilla Chalet, RPH-CPP   1 year ago Essential hypertension   [facility] Drucilla Chalet, RPH-CPP   1 year ago Physical exam, annual   [place] [facility] Marcine Matar, MD       Future Appointments             In 1 week Claiborne Rigg, NP [facility]            Passed - Last BP in normal range    BP Readings from Last 1 Encounters:  03/10/22 131/89         Passed - Last Heart Rate in normal range    Pulse Readings from Last 1 Encounters:  03/10/22 87

## 2022-12-20 ENCOUNTER — Other Ambulatory Visit (HOSPITAL_COMMUNITY): Payer: Self-pay

## 2022-12-25 ENCOUNTER — Other Ambulatory Visit: Payer: Self-pay

## 2022-12-27 ENCOUNTER — Other Ambulatory Visit: Payer: Self-pay

## 2022-12-27 ENCOUNTER — Encounter: Payer: Self-pay | Admitting: Nurse Practitioner

## 2022-12-27 ENCOUNTER — Ambulatory Visit: Payer: Medicaid Other | Attending: Nurse Practitioner | Admitting: Nurse Practitioner

## 2022-12-27 VITALS — BP 144/95 | HR 76 | Ht 72.0 in | Wt 195.0 lb

## 2022-12-27 DIAGNOSIS — Z0000 Encounter for general adult medical examination without abnormal findings: Secondary | ICD-10-CM

## 2022-12-27 DIAGNOSIS — I10 Essential (primary) hypertension: Secondary | ICD-10-CM

## 2022-12-27 DIAGNOSIS — D72819 Decreased white blood cell count, unspecified: Secondary | ICD-10-CM

## 2022-12-27 DIAGNOSIS — E7800 Pure hypercholesterolemia, unspecified: Secondary | ICD-10-CM

## 2022-12-27 MED ORDER — CHLORTHALIDONE 25 MG PO TABS
25.0000 mg | ORAL_TABLET | Freq: Every day | ORAL | 1 refills | Status: DC
  Filled 2022-12-27 – 2023-01-15 (×2): qty 90, 90d supply, fill #0
  Filled 2023-05-26: qty 90, 90d supply, fill #1

## 2022-12-27 MED ORDER — VALSARTAN 80 MG PO TABS
80.0000 mg | ORAL_TABLET | Freq: Every day | ORAL | 1 refills | Status: DC
  Filled 2022-12-27: qty 90, 90d supply, fill #0
  Filled 2023-05-26: qty 90, 90d supply, fill #1

## 2022-12-27 MED ORDER — AMLODIPINE BESYLATE 10 MG PO TABS
10.0000 mg | ORAL_TABLET | Freq: Every day | ORAL | 1 refills | Status: DC
  Filled 2022-12-27 – 2022-12-30 (×2): qty 90, 90d supply, fill #0
  Filled 2023-05-26: qty 90, 90d supply, fill #1

## 2022-12-27 NOTE — Progress Notes (Signed)
Assessment & Plan:  Paul Molina was seen today for hypertension and annual exam.  Diagnoses and all orders for this visit:  Physical exam, annual -     CMP14+EGFR -     CBC with Differential -     Lipid panel  Primary hypertension Continue all antihypertensives as prescribed.  Reminded to bring in blood pressure log for follow  up appointment.  RECOMMENDATIONS: DASH/Mediterranean Diets are healthier choices for HTN.   -     amLODipine (NORVASC) 10 MG tablet; Take 1 tablet (10 mg total) by mouth daily. -     chlorthalidone (HYGROTON) 25 MG tablet; Take 1 tablet by mouth daily. -     valsartan (DIOVAN) 80 MG tablet; Take 1 tablet (80 mg total) by mouth daily. (Patient not taking: Reported on 12/27/2022) -     CMP14+EGFR -     Urinalysis, Complete  Hypercholesterolemia INSTRUCTIONS: Work on a low fat, heart healthy diet and participate in regular aerobic exercise program by working out at least 150 minutes per week; 5 days a week-30 minutes per day. Avoid red meat/beef/steak,  fried foods. junk foods, sodas, sugary drinks, unhealthy snacking, alcohol and smoking.  Drink at least 80 oz of water per day and monitor your carbohydrate intake daily.   -     Lipid panel  Leukopenia, unspecified type -     CBC with Differential    Patient has been counseled on age-appropriate routine health concerns for screening and prevention. These are reviewed and up-to-date. Referrals have been placed accordingly. Immunizations are up-to-date or declined.    Subjective:   Chief Complaint  Patient presents with   Hypertension   Annual Exam   Hypertension Pertinent negatives include no blurred vision, chest pain, headaches, malaise/fatigue, palpitations or shortness of breath.   Paul Molina 47 y.o. male presents to office today for for annual physical and HTN.   HTN Blood pressure is not well controlled. He has been out of chlorthalidone 25 mg daily. Currently taking valsartan 80mg  and  amlodipine 10 mg daily as prescribed.  BP Readings from Last 3 Encounters:  12/27/22 (!) 149/89  03/10/22 131/89  02/02/22 (!) 138/97     Review of Systems  Constitutional:  Negative for fever, malaise/fatigue and weight loss.  HENT: Negative.  Negative for nosebleeds.   Eyes: Negative.  Negative for blurred vision, double vision and photophobia.  Respiratory: Negative.  Negative for cough and shortness of breath.   Cardiovascular: Negative.  Negative for chest pain, palpitations and leg swelling.  Gastrointestinal: Negative.  Negative for heartburn, nausea and vomiting.  Genitourinary: Negative.   Musculoskeletal: Negative.  Negative for myalgias.  Skin: Negative.   Neurological: Negative.  Negative for dizziness, focal weakness, seizures and headaches.  Endo/Heme/Allergies: Negative.   Psychiatric/Behavioral: Negative.  Negative for suicidal ideas.     Past Medical History:  Diagnosis Date   Anxiety    Hypertension     Past Surgical History:  Procedure Laterality Date   NO PAST SURGERIES      Family History  Problem Relation Age of Onset   Hypertension Mother    Hypertension Father    Stroke Father     Social History Reviewed with no changes to be made today.   Outpatient Medications Prior to Visit  Medication Sig Dispense Refill   nicotine (NICODERM CQ - DOSED IN MG/24 HOURS) 14 mg/24hr patch Place 1 patch (14 mg total) onto the skin daily. (Patient not taking: Reported on 12/27/2022) 28 patch 2  amLODipine (NORVASC) 10 MG tablet Take 1 tablet (10 mg total) by mouth daily. 15 tablet 0   chlorthalidone (HYGROTON) 25 MG tablet Take 1 tablet by mouth daily. 90 tablet 1   valsartan (DIOVAN) 80 MG tablet Take 1 tablet (80 mg total) by mouth daily. 30 tablet 0   No facility-administered medications prior to visit.    No Known Allergies     Objective:    BP (!) 149/89 (BP Location: Left Arm, Patient Position: Sitting, Cuff Size: Normal)   Pulse 76   Ht 6' (1.829  m)   Wt 195 lb (88.5 kg)   SpO2 100%   BMI 26.45 kg/m  Wt Readings from Last 3 Encounters:  12/27/22 195 lb (88.5 kg)  03/10/22 198 lb 6.4 oz (90 kg)  10/28/21 197 lb (89.4 kg)    Physical Exam Vitals and nursing note reviewed.  Constitutional:      Appearance: He is well-developed.  HENT:     Head: Normocephalic and atraumatic.     Mouth/Throat:     Dentition: Gingival swelling present.  Cardiovascular:     Rate and Rhythm: Normal rate and regular rhythm.     Heart sounds: Normal heart sounds. No murmur heard.    No friction rub. No gallop.  Pulmonary:     Effort: Pulmonary effort is normal. No tachypnea or respiratory distress.     Breath sounds: Normal breath sounds. No decreased breath sounds, wheezing, rhonchi or rales.  Chest:     Chest wall: No tenderness.  Abdominal:     General: Bowel sounds are normal.     Palpations: Abdomen is soft.  Musculoskeletal:        General: Normal range of motion.     Cervical back: Normal range of motion.  Skin:    General: Skin is warm and dry.  Neurological:     Mental Status: He is alert and oriented to person, place, and time.     Coordination: Coordination normal.  Psychiatric:        Behavior: Behavior normal. Behavior is cooperative.        Thought Content: Thought content normal.        Judgment: Judgment normal.          Patient has been counseled extensively about nutrition and exercise as well as the importance of adherence with medications and regular follow-up. The patient was given clear instructions to go to ER or return to medical center if symptoms don't improve, worsen or new problems develop. The patient verbalized understanding.   Follow-up: Return in about 3 months (around 03/29/2023).   Claiborne Rigg, FNP-BC [facility] and [facility] [place], [place] [phone]   12/27/2022, 2:06 PM

## 2022-12-28 LAB — CBC WITH DIFFERENTIAL/PLATELET
Basophils Absolute: 0 10*3/uL (ref 0.0–0.2)
Basos: 1 %
EOS (ABSOLUTE): 0.1 10*3/uL (ref 0.0–0.4)
Eos: 3 %
Hematocrit: 47.1 % (ref 37.5–51.0)
Hemoglobin: 16.2 g/dL (ref 13.0–17.7)
Immature Grans (Abs): 0 10*3/uL (ref 0.0–0.1)
Immature Granulocytes: 0 %
Lymphocytes Absolute: 1.9 10*3/uL (ref 0.7–3.1)
Lymphs: 36 %
MCH: 32.3 pg (ref 26.6–33.0)
MCHC: 34.4 g/dL (ref 31.5–35.7)
MCV: 94 fL (ref 79–97)
Monocytes Absolute: 0.4 10*3/uL (ref 0.1–0.9)
Monocytes: 7 %
Neutrophils Absolute: 2.8 10*3/uL (ref 1.4–7.0)
Neutrophils: 53 %
Platelets: 310 10*3/uL (ref 150–450)
RBC: 5.02 x10E6/uL (ref 4.14–5.80)
RDW: 14.4 % (ref 11.6–15.4)
WBC: 5.3 10*3/uL (ref 3.4–10.8)

## 2022-12-28 LAB — CMP14+EGFR
ALT: 43 IU/L (ref 0–44)
AST: 31 IU/L (ref 0–40)
Albumin/Globulin Ratio: 1.4 (ref 1.2–2.2)
Albumin: 4.7 g/dL (ref 4.1–5.1)
Alkaline Phosphatase: 70 IU/L (ref 44–121)
BUN/Creatinine Ratio: 16 (ref 9–20)
BUN: 16 mg/dL (ref 6–24)
Bilirubin Total: 0.5 mg/dL (ref 0.0–1.2)
CO2: 22 mmol/L (ref 20–29)
Calcium: 10.2 mg/dL (ref 8.7–10.2)
Chloride: 97 mmol/L (ref 96–106)
Creatinine, Ser: 1.01 mg/dL (ref 0.76–1.27)
Globulin, Total: 3.3 g/dL (ref 1.5–4.5)
Glucose: 82 mg/dL (ref 70–99)
Potassium: 3.3 mmol/L — ABNORMAL LOW (ref 3.5–5.2)
Sodium: 139 mmol/L (ref 134–144)
Total Protein: 8 g/dL (ref 6.0–8.5)
eGFR: 92 mL/min/{1.73_m2} (ref >59)

## 2022-12-28 LAB — URINALYSIS, COMPLETE
Bilirubin, UA: NEGATIVE
Glucose, UA: NEGATIVE
Ketones, UA: NEGATIVE
Leukocytes,UA: NEGATIVE
Nitrite, UA: NEGATIVE
Protein,UA: NEGATIVE
RBC, UA: NEGATIVE
Specific Gravity, UA: 1.021 (ref 1.005–1.030)
Urobilinogen, Ur: 0.2 mg/dL (ref 0.2–1.0)
pH, UA: 5.5 (ref 5.0–7.5)

## 2022-12-28 LAB — MICROSCOPIC EXAMINATION
Bacteria, UA: NONE SEEN
Casts: NONE SEEN /lpf
RBC, Urine: NONE SEEN /hpf (ref 0–2)

## 2022-12-28 LAB — LIPID PANEL
Chol/HDL Ratio: 4 ratio (ref 0.0–5.0)
Cholesterol, Total: 212 mg/dL — ABNORMAL HIGH (ref 100–199)
HDL: 53 mg/dL (ref >39)
LDL Chol Calc (NIH): 138 mg/dL — ABNORMAL HIGH (ref 0–99)
Triglycerides: 118 mg/dL (ref 0–149)
VLDL Cholesterol Cal: 21 mg/dL (ref 5–40)

## 2022-12-30 ENCOUNTER — Other Ambulatory Visit: Payer: Self-pay

## 2023-01-02 ENCOUNTER — Other Ambulatory Visit: Payer: Self-pay

## 2023-01-04 ENCOUNTER — Other Ambulatory Visit: Payer: Self-pay

## 2023-01-06 DIAGNOSIS — Z419 Encounter for procedure for purposes other than remedying health state, unspecified: Secondary | ICD-10-CM | POA: Diagnosis not present

## 2023-01-15 ENCOUNTER — Other Ambulatory Visit: Payer: Self-pay

## 2023-02-01 ENCOUNTER — Encounter: Payer: Self-pay | Admitting: Nurse Practitioner

## 2023-02-05 DIAGNOSIS — Z419 Encounter for procedure for purposes other than remedying health state, unspecified: Secondary | ICD-10-CM | POA: Diagnosis not present

## 2023-03-08 DIAGNOSIS — Z419 Encounter for procedure for purposes other than remedying health state, unspecified: Secondary | ICD-10-CM | POA: Diagnosis not present

## 2023-03-14 DIAGNOSIS — F411 Generalized anxiety disorder: Secondary | ICD-10-CM | POA: Diagnosis not present

## 2023-03-16 DIAGNOSIS — F411 Generalized anxiety disorder: Secondary | ICD-10-CM | POA: Diagnosis not present

## 2023-03-18 DIAGNOSIS — F411 Generalized anxiety disorder: Secondary | ICD-10-CM | POA: Diagnosis not present

## 2023-03-19 DIAGNOSIS — F411 Generalized anxiety disorder: Secondary | ICD-10-CM | POA: Diagnosis not present

## 2023-03-21 DIAGNOSIS — F411 Generalized anxiety disorder: Secondary | ICD-10-CM | POA: Diagnosis not present

## 2023-03-24 DIAGNOSIS — F411 Generalized anxiety disorder: Secondary | ICD-10-CM | POA: Diagnosis not present

## 2023-03-28 DIAGNOSIS — F411 Generalized anxiety disorder: Secondary | ICD-10-CM | POA: Diagnosis not present

## 2023-03-30 ENCOUNTER — Ambulatory Visit: Payer: 59 | Attending: Nurse Practitioner | Admitting: Nurse Practitioner

## 2023-03-31 DIAGNOSIS — F411 Generalized anxiety disorder: Secondary | ICD-10-CM | POA: Diagnosis not present

## 2023-04-01 DIAGNOSIS — F411 Generalized anxiety disorder: Secondary | ICD-10-CM | POA: Diagnosis not present

## 2023-04-02 DIAGNOSIS — F411 Generalized anxiety disorder: Secondary | ICD-10-CM | POA: Diagnosis not present

## 2023-04-04 DIAGNOSIS — F411 Generalized anxiety disorder: Secondary | ICD-10-CM | POA: Diagnosis not present

## 2023-04-05 DIAGNOSIS — F411 Generalized anxiety disorder: Secondary | ICD-10-CM | POA: Diagnosis not present

## 2023-04-08 DIAGNOSIS — F411 Generalized anxiety disorder: Secondary | ICD-10-CM | POA: Diagnosis not present

## 2023-04-08 DIAGNOSIS — Z419 Encounter for procedure for purposes other than remedying health state, unspecified: Secondary | ICD-10-CM | POA: Diagnosis not present

## 2023-04-10 DIAGNOSIS — F411 Generalized anxiety disorder: Secondary | ICD-10-CM | POA: Diagnosis not present

## 2023-04-16 DIAGNOSIS — F411 Generalized anxiety disorder: Secondary | ICD-10-CM | POA: Diagnosis not present

## 2023-04-20 ENCOUNTER — Encounter: Payer: Self-pay | Admitting: Pharmacist

## 2023-04-20 DIAGNOSIS — F411 Generalized anxiety disorder: Secondary | ICD-10-CM | POA: Diagnosis not present

## 2023-04-21 DIAGNOSIS — F411 Generalized anxiety disorder: Secondary | ICD-10-CM | POA: Diagnosis not present

## 2023-04-24 DIAGNOSIS — F411 Generalized anxiety disorder: Secondary | ICD-10-CM | POA: Diagnosis not present

## 2023-04-25 ENCOUNTER — Ambulatory Visit: Payer: 59 | Admitting: Nurse Practitioner

## 2023-04-27 DIAGNOSIS — F411 Generalized anxiety disorder: Secondary | ICD-10-CM | POA: Diagnosis not present

## 2023-04-30 DIAGNOSIS — F411 Generalized anxiety disorder: Secondary | ICD-10-CM | POA: Diagnosis not present

## 2023-05-02 DIAGNOSIS — F411 Generalized anxiety disorder: Secondary | ICD-10-CM | POA: Diagnosis not present

## 2023-05-05 DIAGNOSIS — F411 Generalized anxiety disorder: Secondary | ICD-10-CM | POA: Diagnosis not present

## 2023-05-08 DIAGNOSIS — Z419 Encounter for procedure for purposes other than remedying health state, unspecified: Secondary | ICD-10-CM | POA: Diagnosis not present

## 2023-05-08 DIAGNOSIS — F411 Generalized anxiety disorder: Secondary | ICD-10-CM | POA: Diagnosis not present

## 2023-05-11 DIAGNOSIS — F411 Generalized anxiety disorder: Secondary | ICD-10-CM | POA: Diagnosis not present

## 2023-05-13 DIAGNOSIS — F411 Generalized anxiety disorder: Secondary | ICD-10-CM | POA: Diagnosis not present

## 2023-05-17 DIAGNOSIS — F411 Generalized anxiety disorder: Secondary | ICD-10-CM | POA: Diagnosis not present

## 2023-05-24 DIAGNOSIS — F411 Generalized anxiety disorder: Secondary | ICD-10-CM | POA: Diagnosis not present

## 2023-05-26 DIAGNOSIS — F411 Generalized anxiety disorder: Secondary | ICD-10-CM | POA: Diagnosis not present

## 2023-05-28 DIAGNOSIS — F411 Generalized anxiety disorder: Secondary | ICD-10-CM | POA: Diagnosis not present

## 2023-05-29 ENCOUNTER — Other Ambulatory Visit: Payer: Self-pay

## 2023-05-31 DIAGNOSIS — F411 Generalized anxiety disorder: Secondary | ICD-10-CM | POA: Diagnosis not present

## 2023-06-06 DIAGNOSIS — F411 Generalized anxiety disorder: Secondary | ICD-10-CM | POA: Diagnosis not present

## 2023-06-08 DIAGNOSIS — Z419 Encounter for procedure for purposes other than remedying health state, unspecified: Secondary | ICD-10-CM | POA: Diagnosis not present

## 2023-06-08 DIAGNOSIS — F411 Generalized anxiety disorder: Secondary | ICD-10-CM | POA: Diagnosis not present

## 2023-06-12 DIAGNOSIS — F411 Generalized anxiety disorder: Secondary | ICD-10-CM | POA: Diagnosis not present

## 2023-06-28 ENCOUNTER — Encounter (HOSPITAL_COMMUNITY): Payer: Self-pay | Admitting: Emergency Medicine

## 2023-06-28 ENCOUNTER — Other Ambulatory Visit: Payer: Self-pay

## 2023-06-28 ENCOUNTER — Emergency Department (HOSPITAL_COMMUNITY): Payer: 59

## 2023-06-28 ENCOUNTER — Inpatient Hospital Stay (HOSPITAL_COMMUNITY)
Admission: EM | Admit: 2023-06-28 | Discharge: 2023-07-02 | DRG: 322 | Disposition: A | Discharge status: Home / Self Care | Payer: 59 | Attending: Internal Medicine | Admitting: Internal Medicine

## 2023-06-28 DIAGNOSIS — J9811 Atelectasis: Secondary | ICD-10-CM | POA: Diagnosis not present

## 2023-06-28 DIAGNOSIS — Z79899 Other long term (current) drug therapy: Secondary | ICD-10-CM | POA: Diagnosis not present

## 2023-06-28 DIAGNOSIS — I319 Disease of pericardium, unspecified: Secondary | ICD-10-CM | POA: Diagnosis present

## 2023-06-28 DIAGNOSIS — E785 Hyperlipidemia, unspecified: Secondary | ICD-10-CM | POA: Diagnosis present

## 2023-06-28 DIAGNOSIS — R918 Other nonspecific abnormal finding of lung field: Secondary | ICD-10-CM | POA: Diagnosis not present

## 2023-06-28 DIAGNOSIS — I959 Hypotension, unspecified: Secondary | ICD-10-CM | POA: Diagnosis present

## 2023-06-28 DIAGNOSIS — I10 Essential (primary) hypertension: Secondary | ICD-10-CM | POA: Diagnosis present

## 2023-06-28 DIAGNOSIS — Z720 Tobacco use: Secondary | ICD-10-CM | POA: Diagnosis not present

## 2023-06-28 DIAGNOSIS — F17210 Nicotine dependence, cigarettes, uncomplicated: Secondary | ICD-10-CM | POA: Diagnosis not present

## 2023-06-28 DIAGNOSIS — F419 Anxiety disorder, unspecified: Secondary | ICD-10-CM | POA: Diagnosis present

## 2023-06-28 DIAGNOSIS — R072 Precordial pain: Secondary | ICD-10-CM | POA: Diagnosis not present

## 2023-06-28 DIAGNOSIS — I119 Hypertensive heart disease without heart failure: Secondary | ICD-10-CM | POA: Diagnosis present

## 2023-06-28 DIAGNOSIS — Z5986 Financial insecurity: Secondary | ICD-10-CM | POA: Diagnosis not present

## 2023-06-28 DIAGNOSIS — Z8249 Family history of ischemic heart disease and other diseases of the circulatory system: Secondary | ICD-10-CM | POA: Diagnosis not present

## 2023-06-28 DIAGNOSIS — R0602 Shortness of breath: Secondary | ICD-10-CM | POA: Diagnosis not present

## 2023-06-28 DIAGNOSIS — Z23 Encounter for immunization: Secondary | ICD-10-CM

## 2023-06-28 DIAGNOSIS — R079 Chest pain, unspecified: Principal | ICD-10-CM | POA: Diagnosis present

## 2023-06-28 DIAGNOSIS — R7982 Elevated C-reactive protein (CRP): Secondary | ICD-10-CM | POA: Diagnosis present

## 2023-06-28 DIAGNOSIS — I160 Hypertensive urgency: Secondary | ICD-10-CM | POA: Diagnosis not present

## 2023-06-28 DIAGNOSIS — Z7902 Long term (current) use of antithrombotics/antiplatelets: Secondary | ICD-10-CM

## 2023-06-28 DIAGNOSIS — I259 Chronic ischemic heart disease, unspecified: Secondary | ICD-10-CM

## 2023-06-28 DIAGNOSIS — E876 Hypokalemia: Secondary | ICD-10-CM | POA: Diagnosis present

## 2023-06-28 DIAGNOSIS — Z634 Disappearance and death of family member: Secondary | ICD-10-CM

## 2023-06-28 DIAGNOSIS — I517 Cardiomegaly: Secondary | ICD-10-CM | POA: Diagnosis present

## 2023-06-28 DIAGNOSIS — R011 Cardiac murmur, unspecified: Secondary | ICD-10-CM | POA: Diagnosis present

## 2023-06-28 DIAGNOSIS — I25110 Atherosclerotic heart disease of native coronary artery with unstable angina pectoris: Principal | ICD-10-CM | POA: Diagnosis present

## 2023-06-28 DIAGNOSIS — Z7982 Long term (current) use of aspirin: Secondary | ICD-10-CM

## 2023-06-28 DIAGNOSIS — R9431 Abnormal electrocardiogram [ECG] [EKG]: Secondary | ICD-10-CM

## 2023-06-28 DIAGNOSIS — Z955 Presence of coronary angioplasty implant and graft: Secondary | ICD-10-CM

## 2023-06-28 LAB — TROPONIN I (HIGH SENSITIVITY)
Troponin I (High Sensitivity): 6 ng/L (ref <18)
Troponin I (High Sensitivity): 7 ng/L (ref <18)
Troponin I (High Sensitivity): 6 ng/L (ref <18)

## 2023-06-28 LAB — BASIC METABOLIC PANEL
Anion gap: 10 (ref 5–15)
BUN: 10 mg/dL (ref 6–20)
CO2: 27 mmol/L (ref 22–32)
Calcium: 9.8 mg/dL (ref 8.9–10.3)
Chloride: 96 mmol/L — ABNORMAL LOW (ref 98–111)
Creatinine, Ser: 1.11 mg/dL (ref 0.61–1.24)
GFR, Estimated: >60 mL/min (ref >60)
Glucose, Bld: 127 mg/dL — ABNORMAL HIGH (ref 70–99)
Potassium: 3.7 mmol/L (ref 3.5–5.1)
Sodium: 133 mmol/L — ABNORMAL LOW (ref 135–145)

## 2023-06-28 LAB — D-DIMER, QUANTITATIVE: D-Dimer, Quant: <0.27 ug{FEU}/mL (ref 0.00–0.50)

## 2023-06-28 LAB — CBC
HCT: 44.9 % (ref 39.0–52.0)
Hemoglobin: 15.7 g/dL (ref 13.0–17.0)
MCH: 32.8 pg (ref 26.0–34.0)
MCHC: 35 g/dL (ref 30.0–36.0)
MCV: 93.7 fL (ref 80.0–100.0)
Platelets: 223 10*3/uL (ref 150–400)
RBC: 4.79 MIL/uL (ref 4.22–5.81)
RDW: 13.5 % (ref 11.5–15.5)
WBC: 9.1 10*3/uL (ref 4.0–10.5)
nRBC: 0 % (ref 0.0–0.2)

## 2023-06-28 LAB — LIPID PANEL
Cholesterol: 218 mg/dL — ABNORMAL HIGH (ref 0–200)
HDL: 85 mg/dL (ref >40)
LDL Cholesterol: 112 mg/dL — ABNORMAL HIGH (ref 0–99)
Total CHOL/HDL Ratio: 2.6 {ratio}
Triglycerides: 105 mg/dL (ref <150)
VLDL: 21 mg/dL (ref 0–40)

## 2023-06-28 LAB — HEMOGLOBIN A1C
Hgb A1c MFr Bld: 5.6 % (ref 4.8–5.6)
Mean Plasma Glucose: 114.02 mg/dL

## 2023-06-28 LAB — SEDIMENTATION RATE: Sed Rate: 6 mm/h (ref 0–16)

## 2023-06-28 LAB — C-REACTIVE PROTEIN: CRP: 8.3 mg/dL — ABNORMAL HIGH (ref <1.0)

## 2023-06-28 MED ORDER — IBUPROFEN 200 MG PO TABS
600.0000 mg | ORAL_TABLET | Freq: Three times a day (TID) | ORAL | Status: DC
  Administered 2023-06-28 – 2023-06-29 (×3): 600 mg via ORAL
  Filled 2023-06-28 (×4): qty 3

## 2023-06-28 MED ORDER — INFLUENZA VIRUS VACC SPLIT PF (FLUZONE) 0.5 ML IM SUSY
0.5000 mL | PREFILLED_SYRINGE | INTRAMUSCULAR | Status: AC
  Administered 2023-06-29: 0.5 mL via INTRAMUSCULAR
  Filled 2023-06-28: qty 0.5

## 2023-06-28 MED ORDER — ASPIRIN 81 MG PO CHEW
324.0000 mg | CHEWABLE_TABLET | Freq: Once | ORAL | Status: AC
  Administered 2023-06-28: 324 mg via ORAL
  Filled 2023-06-28: qty 4

## 2023-06-28 MED ORDER — ACETAMINOPHEN 325 MG PO TABS
650.0000 mg | ORAL_TABLET | Freq: Four times a day (QID) | ORAL | Status: DC | PRN
  Administered 2023-06-28: 650 mg via ORAL
  Filled 2023-06-28 (×2): qty 2

## 2023-06-28 MED ORDER — ASPIRIN 81 MG PO CHEW
81.0000 mg | CHEWABLE_TABLET | Freq: Every day | ORAL | Status: DC
  Administered 2023-06-29 – 2023-07-01 (×3): 81 mg via ORAL
  Filled 2023-06-28 (×3): qty 1

## 2023-06-28 MED ORDER — PANTOPRAZOLE SODIUM 20 MG PO TBEC
20.0000 mg | DELAYED_RELEASE_TABLET | Freq: Every day | ORAL | Status: DC
  Administered 2023-06-28 – 2023-07-02 (×5): 20 mg via ORAL
  Filled 2023-06-28 (×5): qty 1

## 2023-06-28 MED ORDER — COLCHICINE 0.6 MG PO TABS
0.6000 mg | ORAL_TABLET | Freq: Two times a day (BID) | ORAL | Status: DC
Start: 2023-06-29 — End: 2023-07-02
  Administered 2023-06-29 – 2023-07-02 (×7): 0.6 mg via ORAL
  Filled 2023-06-28 (×8): qty 1

## 2023-06-28 MED ORDER — IOHEXOL 350 MG/ML SOLN
50.0000 mL | Freq: Once | INTRAVENOUS | Status: AC | PRN
  Administered 2023-06-28: 50 mL via INTRAVENOUS

## 2023-06-28 MED ORDER — NITROGLYCERIN 0.4 MG SL SUBL
0.4000 mg | SUBLINGUAL_TABLET | SUBLINGUAL | Status: DC | PRN
  Administered 2023-06-28: 0.4 mg via SUBLINGUAL
  Filled 2023-06-28: qty 1

## 2023-06-28 MED ORDER — SODIUM CHLORIDE 0.9 % IV BOLUS
1000.0000 mL | Freq: Once | INTRAVENOUS | Status: AC
  Administered 2023-06-28: 1000 mL via INTRAVENOUS

## 2023-06-28 MED ORDER — ATORVASTATIN CALCIUM 80 MG PO TABS
80.0000 mg | ORAL_TABLET | Freq: Every day | ORAL | Status: DC
Start: 2023-06-28 — End: 2023-07-02
  Administered 2023-06-28 – 2023-07-02 (×5): 80 mg via ORAL
  Filled 2023-06-28 (×5): qty 1

## 2023-06-28 MED ORDER — MELATONIN 3 MG PO TABS
3.0000 mg | ORAL_TABLET | Freq: Every day | ORAL | Status: DC
Start: 2023-06-28 — End: 2023-07-02
  Administered 2023-06-28 – 2023-07-01 (×4): 3 mg via ORAL
  Filled 2023-06-28 (×4): qty 1

## 2023-06-28 MED ORDER — HEPARIN SODIUM (PORCINE) 5000 UNIT/ML IJ SOLN
5000.0000 [IU] | Freq: Three times a day (TID) | INTRAMUSCULAR | Status: DC
  Administered 2023-06-28 – 2023-07-02 (×12): 5000 [IU] via SUBCUTANEOUS
  Filled 2023-06-28 (×12): qty 1

## 2023-06-28 NOTE — Progress Notes (Signed)
Dr Rayvon Char at bedside to assess and discuss plan of care and lab/test results w/ him .

## 2023-06-28 NOTE — ED Provider Notes (Addendum)
[place] EMERGENCY DEPARTMENT AT [facility] Provider Note   CSN: [id number] Arrival date & time: 06/28/23  2536     History  Chief Complaint  Patient presents with   Chest Pain    Paul Molina is a 47 y.o. male.  Patient is a 47 year old male with past medical history of hypertension and current tobacco smoker presenting for complaints of chest pain.  Patient admits to substernal chest pain, nonradiating, severe, described as 10 out of 10 pain, x 24 hours.  Improves with leaning forward.  He states his pain started yesterday and has not improved.  He denies any fevers, chills, coughing, or history of recent URI.  He denies any shortness of breath, wheezing, orthopnea, lower extremity edema.  He denies any unilateral leg swelling, calf tenderness, recent injury, recent immobilization, recent surgeries, or history of DVTs or PEs.  The history is provided by the patient. No language interpreter was used.  Chest Pain Associated symptoms: no abdominal pain, no back pain, no cough, no fever, no palpitations, no shortness of breath and no vomiting        Home Medications Prior to Admission medications   Medication Sig Start Date End Date Taking? Authorizing Provider  amLODipine (NORVASC) 10 MG tablet Take 1 tablet (10 mg total) by mouth daily. 12/27/22  Yes Claiborne Rigg, NP  chlorthalidone (HYGROTON) 25 MG tablet Take 1 tablet by mouth daily. 12/27/22  Yes Claiborne Rigg, NP  valsartan (DIOVAN) 80 MG tablet Take 1 tablet (80 mg total) by mouth daily. 12/27/22  Yes Claiborne Rigg, NP      Allergies    Patient has no known allergies.    Review of Systems   Review of Systems  Constitutional:  Negative for chills and fever.  HENT:  Negative for ear pain and sore throat.   Eyes:  Negative for pain and visual disturbance.  Respiratory:  Negative for cough and shortness of breath.   Cardiovascular:  Positive for chest pain. Negative for palpitations.   Gastrointestinal:  Negative for abdominal pain and vomiting.  Genitourinary:  Negative for dysuria and hematuria.  Musculoskeletal:  Negative for arthralgias and back pain.  Skin:  Negative for color change and rash.  Neurological:  Negative for seizures and syncope.  All other systems reviewed and are negative.   Physical Exam Updated Vital Signs BP (!) 139/93   Pulse 82   Temp 97.6 F (36.4 C) (Oral)   Resp (!) 22   Ht 6' (1.829 m)   Wt 83.9 kg   SpO2 100%   BMI 25.09 kg/m  Physical Exam Vitals and nursing note reviewed.  Constitutional:      General: He is not in acute distress.    Appearance: He is well-developed.  HENT:     Head: Normocephalic and atraumatic.  Eyes:     Conjunctiva/sclera: Conjunctivae normal.  Cardiovascular:     Rate and Rhythm: Normal rate and regular rhythm.     Heart sounds: No murmur heard. Pulmonary:     Effort: Pulmonary effort is normal. No respiratory distress.     Breath sounds: Normal breath sounds.  Abdominal:     Palpations: Abdomen is soft.     Tenderness: There is no abdominal tenderness.  Musculoskeletal:        General: No swelling.     Cervical back: Neck supple.  Skin:    General: Skin is warm and dry.     Capillary Refill: Capillary refill takes less  than 2 seconds.  Neurological:     Mental Status: He is alert.  Psychiatric:        Mood and Affect: Mood normal.     ED Results / Procedures / Treatments   Labs (all labs ordered are listed, but only abnormal results are displayed) Labs Reviewed  BASIC METABOLIC PANEL - Abnormal; Notable for the following components:      Result Value   Sodium 133 (*)    Chloride 96 (*)    Glucose, Bld 127 (*)    All other components within normal limits  CBC  D-DIMER, QUANTITATIVE  TROPONIN I (HIGH SENSITIVITY)  TROPONIN I (HIGH SENSITIVITY)  TROPONIN I (HIGH SENSITIVITY)    EKG EKG Interpretation Date/Time:  Thursday June 28 2023 05:51:21 EST Ventricular Rate:   94 PR Interval:  154 QRS Duration:  104 QT Interval:  338 QTC Calculation: 422 R Axis:   101  Text Interpretation: Normal sinus rhythm Rightward axis Biventricular hypertrophy T wave inversion inferior leads Abnormal ECG When compared with ECG of 01-Oct-2019 16:12, PREVIOUS ECG IS PRESENT Confirmed by Edwin Dada ([id number]) on 06/28/2023 7:27:12 AM  Radiology CT Chest W Contrast  Result Date: 06/28/2023 CLINICAL DATA:  47 year old male with chest pain and shortness of breath. Smoker. Symptoms exacerbated by lying on left side. EXAM: CT CHEST WITH CONTRAST TECHNIQUE: Multidetector CT imaging of the chest was performed during intravenous contrast administration. RADIATION DOSE REDUCTION: This exam was performed according to the departmental dose-optimization program which includes automated exposure control, adjustment of the mA and/or kV according to patient size and/or use of iterative reconstruction technique. CONTRAST:  50mL OMNIPAQUE IOHEXOL 350 MG/ML SOLN COMPARISON:  Chest radiographs 0640 hours today. FINDINGS: Cardiovascular: Calcified coronary artery plaque is evident on series 3, image 100. No cardiomegaly. No pericardial effusion. Negative visible aorta. Proximal great vessels appear negative. Insufficient pulmonary artery contrast on this routine exam. Mediastinum/Nodes: Negative. No mediastinal mass or lymphadenopathy. Lungs/Pleura: Major airways are patent. Symmetric dependent atelectasis in the lungs. No pneumothorax or pleural effusion. Right lateral lung base curvilinear opacity on x-ray appears to correspond to this atelectasis or scarring. No consolidation or convincing pulmonary inflammation. No suspicious lung nodule. Upper Abdomen: Negative visible liver, gallbladder, spleen, pancreas, adrenal glands, kidneys (small simple fluid density right renal cyst - no follow-up imaging recommended), and bowel in the upper abdomen. Musculoskeletal: No acute osseous abnormality identified.  IMPRESSION: 1. Symmetric lung base atelectasis and/or scarring. No acute or inflammatory process identified in the Chest. 2. Calcified coronary artery plaque. Electronically Signed   By: Odessa Fleming M.D.   On: 06/28/2023 10:57   DG Chest 2 View  Result Date: 06/28/2023 CLINICAL DATA:  Chest pain in shortness of breath with exertion. EXAM: CHEST - 2 VIEW COMPARISON:  10/01/2019 FINDINGS: The heart size and mediastinal contours are within normal limits. Mild linear scar like opacity within the lateral right base. No pleural effusion, interstitial edema or airspace disease. The visualized skeletal structures are unremarkable. IMPRESSION: 1. No acute cardiopulmonary disease. 2. Mild linear scar like opacity within the lateral right base. Electronically Signed   By: Signa Kell M.D.   On: 06/28/2023 07:06    Procedures .Critical Care  Performed by: Franne Forts, DO Authorized by: Franne Forts, DO   Critical care provider statement:    Critical care time (minutes):  76   Critical care was time spent personally by me on the following activities:  Development of treatment plan with patient or surrogate, discussions  with consultants, evaluation of patient's response to treatment, examination of patient, ordering and review of laboratory studies, ordering and review of radiographic studies, ordering and performing treatments and interventions, pulse oximetry, re-evaluation of patient's condition and review of old charts   Care discussed with comment:  Cardiology     Medications Ordered in ED Medications  nitroGLYCERIN (NITROSTAT) SL tablet 0.4 mg (0.4 mg Sublingual Given 06/28/23 0739)  aspirin chewable tablet 324 mg (324 mg Oral Given 06/28/23 0738)  sodium chloride 0.9 % bolus 1,000 mL (0 mLs Intravenous Stopped 06/28/23 1002)  iohexol (OMNIPAQUE) 350 MG/ML injection 50 mL (50 mLs Intravenous Contrast Given 06/28/23 1016)    ED Course/ Medical Decision Making/ A&P                                  Medical Decision Making Amount and/or Complexity of Data Reviewed Labs: ordered. Radiology: ordered.  Risk OTC drugs. Prescription drug management. Decision regarding hospitalization.    47 year old male with past medical history of hypertension presenting for complaints of chest pain.  Patient is alert and oriented x 3, no acute distress, afebrile, stable vital signs.  Aspirin and nitroglycerin given.  ECG currently stable without ST segment elevations.  T wave inversions in the inferior leads present.  Initial troponin within normal limits.  D-dimer negative.  Low suspicion pulmonary embolism.  Stable electrolytes.  Called to bedside, patient's blood pressure dropped to 84/64 after nitroglycerin.  Patient's head rest lowered and IV fluids initiated.  Patient remains awake and alert.  Chest x-ray demonstrated some possible scarring in the lung.  Patient is an active smoker with no previous CT chest.  CT IV contrast pending.  Cardiology consulted.  Will admit and consider for cath.  Will withhold nitroglycerin drip at this time due to significant hypotension that occurred although he had significant relief originally with the sublingual nitroglycerin.  Will consider morphine or another pain medication if pain returns.  Cardiology requesting hospitalist admit.  Chest x-ray returned stable with lung scarring only.  No nodules or suspicious masses with patient's smoking history.   Internal medicine teaching service admitting.        Final Clinical Impression(s) / ED Diagnoses Final diagnoses:  Chest pain, unspecified type  T wave inversion in EKG  Myocardial ischemia    Rx / DC Orders ED Discharge Orders     None         Franne Forts, DO 06/28/23 1137    Edwin Dada P, DO 06/28/23 1203

## 2023-06-28 NOTE — Progress Notes (Signed)
47 year old admitted for atypical chest pain.  Called to bedside to assess for worsening chest pain.  Reports 4-5 out of 10, up from around 3 out of 10 earlier.  Pain is worse when he lays on his left side.  It is relieved by leaning forward.  It is worse with a deep breath in.  On exam, he is well-appearing, heart rate and rhythms normal, very soft systolic murmur most audible at cardiac apex, breathing comfortably.  EKG obtained, shows persistent PR elevation in aVR and PR depression most notably in the inferolateral leads.  Review of labs notable for elevated CRP.  47 year old with atypical, positional chest pain and signs of inflammation concerning for acute pericarditis.  Notes from internal medicine and cardiology reviewed.  Will go ahead and start ibuprofen, 600 mg 3 times daily.  Colchicine 0.6 mg twice daily to start tomorrow morning.  Also starting a low-dose of PPI for stomach protection.  Marrianne Mood MD 06/28/2023, 8:50 PM

## 2023-06-28 NOTE — Progress Notes (Signed)
Cardiologist at bedside to assess.

## 2023-06-28 NOTE — Plan of Care (Signed)

## 2023-06-28 NOTE — H&P (Addendum)
Date: 06/28/2023               Patient Name:  Paul Molina MRN: [id number]  DOB: 16-Jan-1976 Age / Sex: 47 y.o., male   PCP: Claiborne Rigg, NP         Medical Service: Internal Medicine Teaching Service         Attending Physician: Dr. Mayford Knife Dorene Ar, MD      First Contact: Dr. Philomena Doheny, MD Pager [phone]    Second Contact: Dr. Rocky Morel, DO Pager [phone]         After Hours (After 5p/  First Contact Pager: [phone]  weekends / holidays): Second Contact Pager: [phone]   SUBJECTIVE   Chief Complaint: chest pain  History of Present Illness:  Mr. Letbetter is a 47 year old with HTN and hyperlipidemia who presented with substernal non-radiating chest pain.  This started yesterday around 3 PM while he was at work.  He works as a Scientist, clinical (histocompatibility and immunogenetics) at [employer].  He was walking when pain started.  He describes pain as pressure that is on his sternum.  Pain persisted and he was not able to sleep overnight prompting him to come to emergency room this morning.  He denies recent chest pain.  He does recall an episode in July 16, 2017 where he came in with chest pain.  Troponins were negative during that ED visit and EKG did not have ST elevations or depressions.  Pain does not improve with different positions.  He denies recent viral illness.  ED Course: Chest pain improved with nitroglycin, but he developed hypotension with BP dipping into the 80s over 40s.  EKG with new T wave inversions, Troponin 6 Given 1 L NS Ddimer < 0.27  Cardiology consulted  Past Medical History: Hypertension Hyperlipidemia  Meds:  Current Meds  Medication Sig   amLODipine (NORVASC) 10 MG tablet Take 1 tablet (10 mg total) by mouth daily.   chlorthalidone (HYGROTON) 25 MG tablet Take 1 tablet by mouth daily.   valsartan (DIOVAN) 80 MG tablet Take 1 tablet (80 mg total) by mouth daily.    Past Surgical History:  Procedure Laterality Date   NO PAST SURGERIES      Social:  Lives With:  Alone, has 3 children who live in [place] He would choose just to get her children to help make decisions if he were to have an event occur that he could not make decisions Occupation: Works as a Scientist, clinical (histocompatibility and immunogenetics) at [employer] of Function: Dependent in ADLs and IADLs PCP: Bertram [place] Substances: Please review at repeat exam tomorrow  Family History: He is unsure of his parents medical history.  His mom passed away a few years ago from dementia and his father passed away in 16-Jul-2010 but he is not sure from what.  His siblings are healthy.  Allergies: Allergies as of 06/28/2023   (No Known Allergies)    Review of Systems: A complete ROS was negative except as per HPI.   OBJECTIVE:   Physical Exam: Blood pressure (!) 139/93, pulse 82, temperature 97.6 F (36.4 C), temperature source Oral, resp. rate (!) 22, height 6' 17-Jul-2023 m), weight 83.9 kg, SpO2 100%.  Constitutional: well-appearing  Cardiovascular: regular rate and rhythm, no m/r/g, no reproducible chest pain Pulmonary/Chest: normal work of breathing on room air, lungs clear to auscultation bilaterally Abdominal: soft, non-tender, non-distended MSK: normal bulk and tone Neurological: alert & oriented x 3, 5/5 strength in bilateral upper and lower extremities, normal  gait Skin: warm and dry  Labs: CBC    Component Value Date/Time   WBC 9.1 06/28/2023 0620   RBC 4.79 06/28/2023 0620   HGB 15.7 06/28/2023 0620   HGB 16.2 12/27/2022 1508   HCT 44.9 06/28/2023 0620   HCT 47.1 12/27/2022 1508   PLT 223 06/28/2023 0620   PLT 310 12/27/2022 1508   MCV 93.7 06/28/2023 0620   MCV 94 12/27/2022 1508   MCH 32.8 06/28/2023 0620   MCHC 35.0 06/28/2023 0620   RDW 13.5 06/28/2023 0620   RDW 14.4 12/27/2022 1508   LYMPHSABS 1.9 12/27/2022 1508   EOSABS 0.1 12/27/2022 1508   BASOSABS 0.0 12/27/2022 1508     CMP     Component Value Date/Time   NA 133 (L) 06/28/2023 0620   NA 139 12/27/2022 1508   K 3.7 06/28/2023 0620   CL 96 (L)  06/28/2023 0620   CO2 27 06/28/2023 0620   GLUCOSE 127 (H) 06/28/2023 0620   BUN 10 06/28/2023 0620   BUN 16 12/27/2022 1508   CREATININE 1.11 06/28/2023 0620   CALCIUM 9.8 06/28/2023 0620   PROT 8.0 12/27/2022 1508   ALBUMIN 4.7 12/27/2022 1508   AST 31 12/27/2022 1508   ALT 43 12/27/2022 1508   ALKPHOS 70 12/27/2022 1508   BILITOT 0.5 12/27/2022 1508   GFRNONAA >60 06/28/2023 0620   GFRAA >60 10/01/2019 1610   Troponin 6-7 A1c 5.6  Lab Results  Component Value Date   CHOL 218 (H) 06/28/2023   HDL 85 06/28/2023   LDLCALC 112 (H) 06/28/2023   TRIG 105 06/28/2023   CHOLHDL 2.6 06/28/2023   Imaging: CT with contrast showed symmetric lung base atelectasis with calcified coronary artery plaque  EKG: T wave inversion in leads ll, lll, and avF  ASSESSMENT & PLAN:   Assessment & Plan by Problem: Active Problems:   Chest pain   Jagjit Klahn is a 47 y.o. person living with a history of hypertension and hyperlipidemia who presented with substernal chest pain and admitted for chest pain on hospital day 0  Chest pain, concerning for unstable angina Patient presents with substernal nonradiating chest pain since yesterday afternoon around 3 PM.  Pain started while he was up and walking but did not improve with rest.  His risk factors for heart disease include hyperlipidemia , smoking history, and hypertension.  He does not have significant history of heart disease in family.  CT chest with contrast showed calcified coronary artery plaque.  EKG showed T wave inversions in leads 2, 3, and aVF.  Troponin at 6 and repeat at 7.  TIMI score of 2 points. Differentials include unstable angina.  It is unusual that he is so young to present with this.  Cardiology has been consulted with consideration for cardiac catheterization.  EKG is not consistent with pericarditis. ASCVD 13.5% -EKG as needed if chest pain worsens with STAT troponin -Appreciate cardiology recommendations -Would avoid  nitroglycerin -start atorvastatin 80 mg  Hypertension Home medications include amlodipine 10 mg, chlorthalidone 25 mg, and valsartan 80 mg. -Hold home medications with hypotension   Diet: Heart Healthy VTE: Heparin IVF: None,None Code: Full  Prior to Admission Living Arrangement: Home, living alone Anticipated Discharge Location: Home Barriers to Discharge: evaluation by cardiology  Dispo: Admit patient to Observation with expected length of stay less than 2 midnights.  Signed: Rudene Christians, DO Internal Medicine Resident PGY-3  06/28/2023, 4:11 PM

## 2023-06-28 NOTE — ED Triage Notes (Addendum)
Pt arrives to ED c/o substernal non-radiating CP x 1 day. Pt states that pain is worse when laying on left side. Pt endorses SOB on exertion, pt denies n/v.

## 2023-06-28 NOTE — Consult Note (Addendum)
Cardiology Consultation   Patient ID: Paul Molina MRN: [id number]; DOB: 04/26/1976  Admit date: 06/28/2023 Date of Consult: 06/28/2023  PCP:  Claiborne Rigg, NP   [facility] Providers Cardiologist:  None        Patient Profile:   Paul Molina is a 47 y.o. male with a history of hypertension, anxiety, and tobacco abuse who is being seen 06/28/2023 for the evaluation of chest pain  at the request of Dr. Mayford Knife.  History of Present Illness:   [name] is a 47 year old male with the above history. He has no known cardiac history.  He presented to the ED today for further evaluation of chest pain. EKG showed normal sinus rhythm with LVH and early repolarization changes as well as some diffuse PR depression and inferior T wave abnormalities. High-sensitivity troponin negative x2. D-dimer negative. Chest x-ray showed no acute findings but did show a mild linear scar like opacity within the lateral right base.  WBC 9.1, Hg 15.7, Plts 223. Na 133, K 3.7, Cl 96, Glucose 127, Cr 1.11, BUN 10. He was given a dose of sublingual Nitroglycerin with improvement in chest pain but developed hypotension with this.  Patient states he was in his usual state of health until yesterday.  He was at work when he felt sudden onset of 10/10 substernal nonradiating chest pain that he describes as a"sharp pressure."  Pain was worse with deep inspiration. It persisted overnight and he reports it was worse when laying down, specifically on his left side" and improved with sitting up. He did not get any sleep last night because of this. He denies any exertional chest pain.  No associated shortness of breath, nausea/ vomiting, diaphoresis.  He denies any recent chest pain.  He was admitted for chest pain in the setting of hypertensive urgency in the setting of running out of his medications in 2021.  Echo at that time showed normal LV function.  Symptoms improved with control of his BP.  He did not  require any ischemic evaluation at that time.  No other history of chest pain.  Is any shortness of breath, orthopnea, PND, edema, palpitations.  He did have some lightheadedness/dizziness when his BP dropped after sublingual Nitroglycerin but no other lightheadedness/dizziness.  No syncope.  He denies any recent fevers or illnesses.    He currently reports 2-3/10 chest pain but appear comfortable.  He has almost a 30 year smoking history and currently smokes about 3 cigarettes per day. He states his father had a history of heart disease but he was unable to specify what exactly.   Past Medical History:  Diagnosis Date   Anxiety    Hypertension     Past Surgical History:  Procedure Laterality Date   NO PAST SURGERIES       Home Medications:  Prior to Admission medications   Medication Sig Start Date End Date Taking? Authorizing Provider  amLODipine (NORVASC) 10 MG tablet Take 1 tablet (10 mg total) by mouth daily. 12/27/22  Yes Claiborne Rigg, NP  chlorthalidone (HYGROTON) 25 MG tablet Take 1 tablet by mouth daily. 12/27/22  Yes Claiborne Rigg, NP  valsartan (DIOVAN) 80 MG tablet Take 1 tablet (80 mg total) by mouth daily. 12/27/22  Yes Claiborne Rigg, NP    Inpatient Medications: Scheduled Meds:  atorvastatin  80 mg Oral Daily   heparin  5,000 Units Subcutaneous Q8H   [START ON 06/29/2023] influenza vac split trivalent PF  0.5  mL Intramuscular Tomorrow-1000   Continuous Infusions:  PRN Meds: acetaminophen, nitroGLYCERIN  Allergies:   No Known Allergies  Social History:   Social History   Socioeconomic History   Marital status: Single    Spouse name: Not on file   Number of children: 2   Years of education: Not on file   Highest education level: Some college, no degree  Occupational History    Employer: [employer]  Tobacco Use   Smoking status: Some Days    Current packs/day: 0.25    Average packs/day: 0.3 packs/day for 28.0 years (7.0 ttl pk-yrs)    Types:  Cigarettes   Smokeless tobacco: Never  Vaping Use   Vaping status: Never Used  Substance and Sexual Activity   Alcohol use: Yes    Comment: drinks 24 can beer daily   Drug use: Not Currently   Sexual activity: Yes  Other Topics Concern   Not on file  Social History Narrative   Not on file   Social Determinants of Health   Financial Resource Strain: High Risk (12/23/2022)   Overall Financial Resource Strain (CARDIA)    Difficulty of Paying Living Expenses: Hard  Food Insecurity: No Food Insecurity (06/28/2023)   Hunger Vital Sign    Worried About Running Out of Food in the Last Year: Never true    Ran Out of Food in the Last Year: Never true  Transportation Needs: No Transportation Needs (06/28/2023)   PRAPARE - Administrator, Civil Service (Medical): No    Lack of Transportation (Non-Medical): No  Physical Activity: Unknown (12/23/2022)   Exercise Vital Sign    Days of Exercise per Week: 0 days    Minutes of Exercise per Session: Not on file  Stress: No Stress Concern Present (12/23/2022)   [employer] of Occupational Health - Occupational Stress Questionnaire    Feeling of Stress : Not at all  Social Connections: Socially Isolated (12/23/2022)   Social Connection and Isolation Panel [NHANES]    Frequency of Communication with Friends and Family: More than three times a week    Frequency of Social Gatherings with Friends and Family: Never    Attends Religious Services: Never    Database administrator or Organizations: No    Attends Engineer, structural: Not on file    Marital Status: Never married  Intimate Partner Violence: Not At Risk (06/28/2023)   Humiliation, Afraid, Rape, and Kick questionnaire    Fear of Current or Ex-Partner: No    Emotionally Abused: No    Physically Abused: No    Sexually Abused: No    Family History:   Family History  Problem Relation Age of Onset   Hypertension Mother    Hypertension Father    Stroke Father       ROS:  Please see the history of present illness.  Review of Systems  Constitutional:  Negative for diaphoresis and fever.  HENT:  Negative for congestion.   Respiratory:  Negative for cough and shortness of breath.   Cardiovascular:  Positive for chest pain. Negative for palpitations, orthopnea, leg swelling and PND.  Gastrointestinal:  Negative for blood in stool, melena, nausea and vomiting.  Genitourinary:  Negative for hematuria.  Musculoskeletal:  Negative for falls.  Neurological:  Negative for loss of consciousness.  Endo/Heme/Allergies:  Does not bruise/bleed easily.  Psychiatric/Behavioral:  Positive for substance abuse (tobacco abuse).      All other ROS reviewed and negative.  Physical Exam/Data:   Vitals:   06/28/23 0815 06/28/23 0947 06/28/23 1011 06/28/23 1100  BP: 108/77 127/89  (!) 139/93  Pulse: 73 74  82  Resp: (!) 23 18  (!) 22  Temp:   97.6 F (36.4 C)   TempSrc:   Oral   SpO2: 98% 97%  100%  Weight:      Height:        Intake/Output Summary (Last 24 hours) at 06/28/2023 1411 Last data filed at 06/28/2023 1002 Gross per 24 hour  Intake 1000 ml  Output --  Net 1000 ml      06/28/2023    6:17 AM 12/27/2022    1:34 PM 03/10/2022    2:56 PM  Last 3 Weights  Weight (lbs) 185 lb 195 lb 198 lb 6.4 oz  Weight (kg) 83.915 kg 88.451 kg 89.994 kg     Body mass index is 25.09 kg/m.  General: 47 y.o. African-American  male resting comfortably in no acute distress. HEENT: Normocephalic and atraumatic. Sclera clear. EOMs intact. Neck: Supple. No carotid bruits. No JVD. Heart: RRR. Distinct S1 and S2. No murmurs, gallops, or rubs. Radial and distal pedal pulses 2+ and equal bilaterally. Lungs: No increased work of breathing. Clear to ausculation bilaterally. No wheezes, rhonchi, or rales.  Abdomen: Soft, non-distended, and non-tender to palpation.  Extremities: No lower extremity edema.    Skin: Warm and dry. Neuro: Alert and oriented x3. No focal  deficits. Psych: Normal affect. Responds appropriately.   EKG:  The EKG was personally reviewed and demonstrates:   Normal sinus rhythm, rate 94 bpm, with LVH and early repolarization changes as well as some diffuse PR depression and inferior T wave abnormalities. Telemetry:  Telemetry was personally reviewed and demonstrates:  Normal sinus rhythm with rates in the 80s to 90s.  Relevant CV Studies:  Echocardiogram 10/02/2019: Impressions:  1. Left ventricular ejection fraction, by estimation, is 60 to 65%. The  left ventricle has normal function. The left ventricle has no regional  wall motion abnormalities. There is moderate concentric left ventricular  hypertrophy. Left ventricular  diastolic parameters are consistent with Grade I diastolic dysfunction  (impaired relaxation).   2. Right ventricular systolic function is normal. The right ventricular  size is normal. Tricuspid regurgitation signal is inadequate for assessing  PA pressure.   3. The mitral valve is normal in structure and function. No evidence of  mitral valve regurgitation. No evidence of mitral stenosis.   4. The aortic valve is normal in structure and function. Aortic valve  regurgitation is not visualized. No aortic stenosis is present.   5. The inferior vena cava is normal in size with <50% respiratory  variability, suggesting right atrial pressure of 8 mmHg.    Laboratory Data:  High Sensitivity Troponin:   Recent Labs  Lab 06/28/23 0620 06/28/23 1134  TROPONINIHS 6 7     Chemistry Recent Labs  Lab 06/28/23 0620  NA 133*  K 3.7  CL 96*  CO2 27  GLUCOSE 127*  BUN 10  CREATININE 1.11  CALCIUM 9.8  GFRNONAA >60  ANIONGAP 10    No results for input(s): "PROT", "ALBUMIN", "AST", "ALT", "ALKPHOS", "BILITOT" in the last 168 hours. Lipids  Recent Labs  Lab 06/28/23 0620  CHOL 218*  TRIG 105  HDL 85  LDLCALC 112*  CHOLHDL 2.6    Hematology Recent Labs  Lab 06/28/23 0620  WBC 9.1  RBC 4.79   HGB 15.7  HCT 44.9  MCV 93.7  MCH 32.8  MCHC 35.0  RDW 13.5  PLT 223   Thyroid No results for input(s): "TSH", "FREET4" in the last 168 hours.  BNPNo results for input(s): "BNP", "PROBNP" in the last 168 hours.  DDimer  Recent Labs  Lab 06/28/23 0748  DDIMER <0.27     Radiology/Studies:  CT Chest W Contrast  Result Date: 06/28/2023 CLINICAL DATA:  47 year old male with chest pain and shortness of breath. Smoker. Symptoms exacerbated by lying on left side. EXAM: CT CHEST WITH CONTRAST TECHNIQUE: Multidetector CT imaging of the chest was performed during intravenous contrast administration. RADIATION DOSE REDUCTION: This exam was performed according to the departmental dose-optimization program which includes automated exposure control, adjustment of the mA and/or kV according to patient size and/or use of iterative reconstruction technique. CONTRAST:  50mL OMNIPAQUE IOHEXOL 350 MG/ML SOLN COMPARISON:  Chest radiographs 0640 hours today. FINDINGS: Cardiovascular: Calcified coronary artery plaque is evident on series 3, image 100. No cardiomegaly. No pericardial effusion. Negative visible aorta. Proximal great vessels appear negative. Insufficient pulmonary artery contrast on this routine exam. Mediastinum/Nodes: Negative. No mediastinal mass or lymphadenopathy. Lungs/Pleura: Major airways are patent. Symmetric dependent atelectasis in the lungs. No pneumothorax or pleural effusion. Right lateral lung base curvilinear opacity on x-ray appears to correspond to this atelectasis or scarring. No consolidation or convincing pulmonary inflammation. No suspicious lung nodule. Upper Abdomen: Negative visible liver, gallbladder, spleen, pancreas, adrenal glands, kidneys (small simple fluid density right renal cyst - no follow-up imaging recommended), and bowel in the upper abdomen. Musculoskeletal: No acute osseous abnormality identified. IMPRESSION: 1. Symmetric lung base atelectasis and/or scarring. No  acute or inflammatory process identified in the Chest. 2. Calcified coronary artery plaque. Electronically Signed   By: Odessa Fleming M.D.   On: 06/28/2023 10:57   DG Chest 2 View  Result Date: 06/28/2023 CLINICAL DATA:  Chest pain in shortness of breath with exertion. EXAM: CHEST - 2 VIEW COMPARISON:  10/01/2019 FINDINGS: The heart size and mediastinal contours are within normal limits. Mild linear scar like opacity within the lateral right base. No pleural effusion, interstitial edema or airspace disease. The visualized skeletal structures are unremarkable. IMPRESSION: 1. No acute cardiopulmonary disease. 2. Mild linear scar like opacity within the lateral right base. Electronically Signed   By: Signa Kell M.D.   On: 06/28/2023 07:06     Assessment and Plan:   Chest Pain Patient presented with sudden onset of atypical chest pain that was worse with deep inspiration and when laying flat. EKG showed normal sinus rhythm, rate 94 bpm, with LVH and early repolarization changes as well as some mild diffuse PR depression and inferior T wave abnormalities. High-sensitivity troponin negative x2. D-dimer negative.  - He is currently having some mild 2-3/10 chest pain. Intermitt   - Symptoms sound atypical and consistent with pericarditis. Will check CRP and Sed Rate. Will order Echo. If CRP and Sed Rate are positive, would treat with Colchicine and NSAIDs.  Will also recomm CCTA since CT today showed coronary calcifications    Although pain is atypical he has come in in the past, need to define dz burden   Hypertension BP mostly well controlled. BP dropped to 84/64 after dose of sublingual Nitroglycerin but has now stabilized. - He is on Amlodipine 10mg  daily, Chlorthalidone 25mg  daily, and Valsartan 80mg  daily at home. None of these were resumed on admission due to episode of hypotension Will monitor and add back in the morning if BP allows. Echo to evaluate LV  function, reassess LV  thickness  Hyperlipidemia Lipid panel this admission: Total Cholesterol 218, Triglycerides 105, HDL 85, LDL 112. CT scan this admission did show some calcified coronary artery plaque so LDL goal <70.  - He has already been started on Lipitor 80mg  daily. Can continue. - Will need repeat lipid panel and LFTs in 6-8 weeks.  Tobacco Abuse Patient has an almost 30 year smoking history and currently smokes 3 cigarettes per day. - Will need to continue to counsel him on the importance of complete cessation.   Risk Assessment/Risk Scores:  {  For questions or updates, please contact [facility] Please consult [email or web address] for contact info under    Signed, Corrin Parker, PA-C  06/28/2023 2:11 PM  Patient seen and examined  I agree with findings as noted by  Thane Edu above  Pt is a 47 yo with hx of HTN, HL and tob abuse (long hx) Presents with a 1 day hx of CP   Atypical   Pleuritic    On and off pressure and sharp since last night  Trop negative  D Dimer negative CT neg for PE but did show coronary artery calcifications  EKG is non-diagnostic due to LVH  Currently T 100.4 Neck   Normal JVP Lungs CTA Chest  Nontender Cardiac RRR  No S3  No murmurs  Abd is supple   No hepatomegaly Ext are without edema   Plan  Agree with recomm of C Goodrich   Check ESR, CRP  Low grade temp makes suspicious for infection/inflammation  If positive rx for pericarditis  Given HTN, tobacco hx and coronary calcifications would also schedule for CCTA  Agree with echo to define LV anatomy and function   Follow BP  Meds on hold (again, susp for inflammation, question infection)   Add back as BP allows   Dietrich Pates MD

## 2023-06-28 NOTE — Progress Notes (Signed)
Notified Dr. Teofilo Pod of left chest pain and shoulder pain 5/10 sharp/pressure. See new orders. Tylenol will be given and if pain persists will notify Dr. Rayvon Char.  EKG and Troponin ordered. Continuing to monitor.

## 2023-06-28 NOTE — ED Notes (Signed)
ED TO INPATIENT HANDOFF REPORT  ED Nurse Name and Phone #: Tynlee Bayle [phone]  S Name/Age/Gender Leonides Schanz 47 y.o. male Room/Bed: 026C/026C  Code Status   Code Status: Full Code  Home/SNF/Other Home Patient oriented to: self, place, time, and situation Is this baseline? Yes   Triage Complete: Triage complete  Chief Complaint Chest pain [R07.9]  Triage Note Pt arrives to ED c/o substernal non-radiating CP x 1 day. Pt states that pain is worse when laying on left side. Pt endorses SOB on exertion, pt denies n/v.    Allergies No Known Allergies  Level of Care/Admitting Diagnosis ED Disposition     ED Disposition  Admit   Condition  --   Comment  Hospital Area: [facility] [[id number]]  Level of Care: Telemetry Medical [[id number]]  May place patient in observation at [facility] or [facility] if equivalent level of care is available:: No  Covid Evaluation: Asymptomatic - no recent exposure (last 10 days) testing not required  Diagnosis: Chest pain [213086]  Admitting Physician: Miguel Aschoff [[id number]]  Attending Physician: Miguel Aschoff [[id number]]          B Medical/Surgery History Past Medical History:  Diagnosis Date   Anxiety    Hypertension    Past Surgical History:  Procedure Laterality Date   NO PAST SURGERIES       A IV Location/Drains/Wounds Patient Lines/Drains/Airways Status     Active Line/Drains/Airways     Name Placement date Placement time Site Days   Peripheral IV 06/28/23 20 G Right Antecubital 06/28/23  0749  Antecubital  less than 1            Intake/Output Last 24 hours  Intake/Output Summary (Last 24 hours) at 06/28/2023 1249 Last data filed at 06/28/2023 1002 Gross per 24 hour  Intake 1000 ml  Output --  Net 1000 ml    Labs/Imaging Results for orders placed or performed during the hospital encounter of 06/28/23 (from the past 48 hour(s))  Basic metabolic panel     Status: Abnormal   Collection  Time: 06/28/23  6:20 AM  Result Value Ref Range   Sodium 133 (L) 135 - 145 mmol/L   Potassium 3.7 3.5 - 5.1 mmol/L   Chloride 96 (L) 98 - 111 mmol/L   CO2 27 22 - 32 mmol/L   Glucose, Bld 127 (H) 70 - 99 mg/dL    Comment: Glucose reference range applies only to samples taken after fasting for at least 8 hours.   BUN 10 6 - 20 mg/dL   Creatinine, Ser 5.78 0.61 - 1.24 mg/dL   Calcium 9.8 8.9 - 46.9 mg/dL   GFR, Estimated >62 >95 mL/min    Comment: (NOTE) Calculated using the CKD-EPI Creatinine Equation (2021)    Anion gap 10 5 - 15    Comment: Performed at [facility], [address], [place], [place] [address]  CBC     Status: None   Collection Time: 06/28/23  6:20 AM  Result Value Ref Range   WBC 9.1 4.0 - 10.5 K/uL   RBC 4.79 4.22 - 5.81 MIL/uL   Hemoglobin 15.7 13.0 - 17.0 g/dL   HCT 24.4 01.0 - 27.2 %   MCV 93.7 80.0 - 100.0 fL   MCH 32.8 26.0 - 34.0 pg   MCHC 35.0 30.0 - 36.0 g/dL   RDW 53.6 64.4 - 03.4 %   Platelets 223 150 - 400 K/uL   nRBC 0.0 0.0 -  0.2 %    Comment: Performed at [facility], [address], [place], [place] [address]  Troponin I (High Sensitivity)     Status: None   Collection Time: 06/28/23  6:20 AM  Result Value Ref Range   Troponin I (High Sensitivity) 6 <18 ng/L    Comment: (NOTE) Elevated high sensitivity troponin I (hsTnI) values and significant  changes across serial measurements may suggest ACS but many other  chronic and acute conditions are known to elevate hsTnI results.  Refer to the "Links" section for chest pain algorithms and additional  guidance. Performed at [facility], [address], [place], [place] [address]   D-dimer, quantitative     Status: None   Collection Time: 06/28/23  7:48 AM  Result Value Ref Range   D-Dimer, Quant <0.27 0.00 - 0.50 ug/mL-FEU    Comment: (NOTE) At the manufacturer cut-off value of 0.5 g/mL FEU, this assay has a negative predictive value of 95-100%.This assay is intended  for use in conjunction with a clinical pretest probability (PTP) assessment model to exclude pulmonary embolism (PE) and deep venous thrombosis (DVT) in outpatients suspected of PE or DVT. Results should be correlated with clinical presentation. Performed at [facility], [address] [place], [place] [address]    CT Chest W Contrast  Result Date: 06/28/2023 CLINICAL DATA:  47 year old male with chest pain and shortness of breath. Smoker. Symptoms exacerbated by lying on left side. EXAM: CT CHEST WITH CONTRAST TECHNIQUE: Multidetector CT imaging of the chest was performed during intravenous contrast administration. RADIATION DOSE REDUCTION: This exam was performed according to the departmental dose-optimization program which includes automated exposure control, adjustment of the mA and/or kV according to patient size and/or use of iterative reconstruction technique. CONTRAST:  50mL OMNIPAQUE IOHEXOL 350 MG/ML SOLN COMPARISON:  Chest radiographs 0640 hours today. FINDINGS: Cardiovascular: Calcified coronary artery plaque is evident on series 3, image 100. No cardiomegaly. No pericardial effusion. Negative visible aorta. Proximal great vessels appear negative. Insufficient pulmonary artery contrast on this routine exam. Mediastinum/Nodes: Negative. No mediastinal mass or lymphadenopathy. Lungs/Pleura: Major airways are patent. Symmetric dependent atelectasis in the lungs. No pneumothorax or pleural effusion. Right lateral lung base curvilinear opacity on x-ray appears to correspond to this atelectasis or scarring. No consolidation or convincing pulmonary inflammation. No suspicious lung nodule. Upper Abdomen: Negative visible liver, gallbladder, spleen, pancreas, adrenal glands, kidneys (small simple fluid density right renal cyst - no follow-up imaging recommended), and bowel in the upper abdomen. Musculoskeletal: No acute osseous abnormality identified. IMPRESSION: 1. Symmetric lung base  atelectasis and/or scarring. No acute or inflammatory process identified in the Chest. 2. Calcified coronary artery plaque. Electronically Signed   By: Odessa Fleming M.D.   On: 06/28/2023 10:57   DG Chest 2 View  Result Date: 06/28/2023 CLINICAL DATA:  Chest pain in shortness of breath with exertion. EXAM: CHEST - 2 VIEW COMPARISON:  10/01/2019 FINDINGS: The heart size and mediastinal contours are within normal limits. Mild linear scar like opacity within the lateral right base. No pleural effusion, interstitial edema or airspace disease. The visualized skeletal structures are unremarkable. IMPRESSION: 1. No acute cardiopulmonary disease. 2. Mild linear scar like opacity within the lateral right base. Electronically Signed   By: Signa Kell M.D.   On: 06/28/2023 07:06    Pending Labs Unresulted Labs (From admission, onward)     Start     Ordered   06/29/23 0500  HIV Antibody (routine testing  w rflx)  (HIV Antibody (Routine testing w reflex) panel)  Tomorrow morning,   R        06/28/23 1228   06/29/23 0500  Basic metabolic panel  Tomorrow morning,   R        06/28/23 1228   06/29/23 0500  CBC  Tomorrow morning,   R        06/28/23 1228   06/28/23 1230  Lipid panel  Add-on,   AD        06/28/23 1229   06/28/23 1230  Hemoglobin A1c  Add-on,   AD        06/28/23 1229            Vitals/Pain Today's Vitals   06/28/23 0947 06/28/23 1008 06/28/23 1011 06/28/23 1100  BP: 127/89   (!) 139/93  Pulse: 74   82  Resp: 18   (!) 22  Temp:   97.6 F (36.4 C)   TempSrc:   Oral   SpO2: 97%   100%  Weight:      Height:      PainSc:  0-No pain      Isolation Precautions No active isolations  Medications Medications  nitroGLYCERIN (NITROSTAT) SL tablet 0.4 mg (0.4 mg Sublingual Given 06/28/23 0739)  heparin injection 5,000 Units (has no administration in time range)  acetaminophen (TYLENOL) tablet 650 mg (has no administration in time range)  atorvastatin (LIPITOR) tablet 80 mg (has no  administration in time range)  aspirin chewable tablet 324 mg (324 mg Oral Given 06/28/23 0738)  sodium chloride 0.9 % bolus 1,000 mL (0 mLs Intravenous Stopped 06/28/23 1002)  iohexol (OMNIPAQUE) 350 MG/ML injection 50 mL (50 mLs Intravenous Contrast Given 06/28/23 1016)    Mobility walks     Focused Assessments Cardiac Assessment Handoff:  Cardiac Rhythm: Normal sinus rhythm No results found for: "CKTOTAL", "CKMB", "CKMBINDEX", "TROPONINI" Lab Results  Component Value Date   DDIMER <0.27 06/28/2023   Does the Patient currently have chest pain? No    R Recommendations: See Admitting Provider Note  Report given to:   Additional Notes:

## 2023-06-29 ENCOUNTER — Encounter (HOSPITAL_COMMUNITY): Payer: Self-pay | Admitting: Internal Medicine

## 2023-06-29 ENCOUNTER — Observation Stay (HOSPITAL_BASED_OUTPATIENT_CLINIC_OR_DEPARTMENT_OTHER): Payer: 59

## 2023-06-29 ENCOUNTER — Observation Stay (HOSPITAL_COMMUNITY): Payer: 59

## 2023-06-29 DIAGNOSIS — I2510 Atherosclerotic heart disease of native coronary artery without angina pectoris: Secondary | ICD-10-CM | POA: Diagnosis not present

## 2023-06-29 DIAGNOSIS — F17210 Nicotine dependence, cigarettes, uncomplicated: Secondary | ICD-10-CM

## 2023-06-29 DIAGNOSIS — R079 Chest pain, unspecified: Secondary | ICD-10-CM | POA: Diagnosis not present

## 2023-06-29 DIAGNOSIS — E876 Hypokalemia: Secondary | ICD-10-CM | POA: Diagnosis not present

## 2023-06-29 DIAGNOSIS — R931 Abnormal findings on diagnostic imaging of heart and coronary circulation: Secondary | ICD-10-CM

## 2023-06-29 DIAGNOSIS — I517 Cardiomegaly: Secondary | ICD-10-CM | POA: Diagnosis present

## 2023-06-29 DIAGNOSIS — E785 Hyperlipidemia, unspecified: Secondary | ICD-10-CM | POA: Diagnosis not present

## 2023-06-29 DIAGNOSIS — Z720 Tobacco use: Secondary | ICD-10-CM | POA: Diagnosis not present

## 2023-06-29 DIAGNOSIS — I10 Essential (primary) hypertension: Secondary | ICD-10-CM

## 2023-06-29 LAB — BASIC METABOLIC PANEL
Anion gap: 10 (ref 5–15)
BUN: 18 mg/dL (ref 6–20)
CO2: 25 mmol/L (ref 22–32)
Calcium: 9.1 mg/dL (ref 8.9–10.3)
Chloride: 100 mmol/L (ref 98–111)
Creatinine, Ser: 1.28 mg/dL — ABNORMAL HIGH (ref 0.61–1.24)
GFR, Estimated: >60 mL/min (ref >60)
Glucose, Bld: 124 mg/dL — ABNORMAL HIGH (ref 70–99)
Potassium: 2.8 mmol/L — ABNORMAL LOW (ref 3.5–5.1)
Sodium: 135 mmol/L (ref 135–145)

## 2023-06-29 LAB — ECHOCARDIOGRAM COMPLETE
AR max vel: 2.7 cm2
AV Area VTI: 2.67 cm2
AV Area mean vel: 2.4 cm2
AV Mean grad: 2 mm[Hg]
AV Peak grad: 3 mm[Hg]
Ao pk vel: 0.86 m/s
Area-P 1/2: 2.2 cm2
Calc EF: 57.5 %
Height: 72 in
MV VTI: 2.15 cm2
S' Lateral: 2.4 cm
Single Plane A2C EF: 53.1 %
Single Plane A4C EF: 60.2 %
Weight: 2792 [oz_av]

## 2023-06-29 LAB — MAGNESIUM: Magnesium: 2 mg/dL (ref 1.7–2.4)

## 2023-06-29 LAB — CBC
HCT: 44.1 % (ref 39.0–52.0)
Hemoglobin: 15.4 g/dL (ref 13.0–17.0)
MCH: 32.6 pg (ref 26.0–34.0)
MCHC: 34.9 g/dL (ref 30.0–36.0)
MCV: 93.4 fL (ref 80.0–100.0)
Platelets: 199 10*3/uL (ref 150–400)
RBC: 4.72 MIL/uL (ref 4.22–5.81)
RDW: 13.2 % (ref 11.5–15.5)
WBC: 5.4 10*3/uL (ref 4.0–10.5)
nRBC: 0 % (ref 0.0–0.2)

## 2023-06-29 LAB — HIV ANTIBODY (ROUTINE TESTING W REFLEX): HIV Screen 4th Generation wRfx: NONREACTIVE

## 2023-06-29 LAB — POTASSIUM: Potassium: 3.7 mmol/L (ref 3.5–5.1)

## 2023-06-29 MED ORDER — POTASSIUM CHLORIDE CRYS ER 20 MEQ PO TBCR
40.0000 meq | EXTENDED_RELEASE_TABLET | Freq: Two times a day (BID) | ORAL | Status: AC
  Administered 2023-06-29 (×2): 40 meq via ORAL
  Filled 2023-06-29 (×2): qty 2

## 2023-06-29 MED ORDER — IOHEXOL 350 MG/ML SOLN
100.0000 mL | Freq: Once | INTRAVENOUS | Status: AC | PRN
  Administered 2023-06-29: 100 mL via INTRAVENOUS

## 2023-06-29 MED ORDER — IVABRADINE HCL 5 MG PO TABS
5.0000 mg | ORAL_TABLET | Freq: Once | ORAL | Status: AC
  Administered 2023-06-29: 5 mg via ORAL
  Filled 2023-06-29: qty 1

## 2023-06-29 MED ORDER — NITROGLYCERIN 0.4 MG SL SUBL
0.8000 mg | SUBLINGUAL_TABLET | Freq: Once | SUBLINGUAL | Status: AC
  Administered 2023-06-29: 0.8 mg via SUBLINGUAL

## 2023-06-29 MED ORDER — NITROGLYCERIN 0.4 MG SL SUBL
SUBLINGUAL_TABLET | SUBLINGUAL | Status: AC
Start: 2023-06-29 — End: ?
  Filled 2023-06-29: qty 2

## 2023-06-29 MED ORDER — METOPROLOL TARTRATE 25 MG PO TABS
25.0000 mg | ORAL_TABLET | Freq: Once | ORAL | Status: AC
  Administered 2023-06-29: 25 mg via ORAL
  Filled 2023-06-29: qty 1

## 2023-06-29 NOTE — Hospital Course (Signed)
Paul Molina is a 47 year old with HTN and hyperlipidemia who presented with substernal non-radiating chest pain. He was admitted to the Internal Medicine Teaching Service on 06/28/2023 for further work up and characterization of chest pain.   Coronary Artery Disease  Cardiac chest pain  Patient presented with substernal nonradiating chest pain since 11/20 afternoon around 3 PM.  Worse with positional change, did not improve with rest. Given aspirin loading dose and nitroglycerin, which improved his chest pain but led to hypotension with BP dipping to 80s/40s. Risk factors for heart disease include hyperlipidemia, smoking history, and hypertension. No significant history of heart disease in family. CT chest with contrast showed calcified coronary artery plaque. EKG showed T wave inversions in leads 2, 3, and aVF.  Troponin peaked at 7. TIMI score of 2 points. Cardiology was consulted in the ED.   Patient's symptoms, including worsening of chest pain on deep inspiration, persistent nature of chest pain, and improvement in symptoms leaning forward initially concerning for pericarditis. However, no history of recent illness, EKG changes not entirely consistent with classic pericarditis, and no evidence of LE edema or friction rub on exam. Patient started on treatment for pericarditis including ibuprofen 600 mg TID, colchicine 0.6 mg BID, and pantoprazole 20 mg daily with improvement in chest pain.  Coronary CT with FFR performed on 11/22 had findings concerning in the LAD, cardiology recommended left heart cath. Results lowered suspicion for pericarditis and ibuprofen was discontinued on 11/23. Cardiology recommended stopping treatment with colchicine following LHC as etiology of chest pain most likely due to unstable angina.   LHC on 11/25 showed 20% ostial to proximal LAD, 80% mid LAD followed by 40% mid LAD, 40% OM1 status post PCI stent of the proximal LAD. Circumflex was dominant. Loaded up on Plavix after  the LHC, will continue DAPT with aspirin and Plavix for at least 6 months   Hypokalemia Patient's morning K 2.8 on 11/22, repleted with PO potassium chloride 40 mEq x 2 doses. K 3.4 11/23, repleted again with POT potassium chloride. On day of discharge, K of 3.4 with repeat repletion of PO potassium chloride 40 mEq x 1 dose.    Hypertension Home medications include amlodipine 10 mg, chlorthalidone 25 mg, and valsartan 80 mg. BP throughout admission remained mostly in the 120s/80s and home antihypertensives were held. Following LHC, patient resumed on amlodipine 10 mg, held chlorthalidone 25 mg and valsartan 80 mg on discharge given stable BP. Will need to repeat BP outpatient before resuming chlorthalidone and valsartan.    Hyperlipidemia Chronic. Lipid panel on admission with total cholesterol of 218 and LDL 112. ASCVD risk 10.1%. Continued atorvastatin 80 mg daily.   Tobacco use Patient with 30 year smoking history, smokes about 3 cigarettes a day. Discussed smoking cessation while inpatient and medication that can aid in cessation (ie NRT or chantix), outpatient follow up needed.

## 2023-06-29 NOTE — Progress Notes (Signed)
Rounding Note    Patient Name: Paul Molina Date of Encounter: 06/29/2023  [facility] Cardiologist:  New   Subjective   Denies CP today  Breathing is OK   Inpatient Medications    Scheduled Meds:  aspirin  81 mg Oral Daily   atorvastatin  80 mg Oral Daily   colchicine  0.6 mg Oral BID   heparin  5,000 Units Subcutaneous Q8H   ibuprofen  600 mg Oral TID   melatonin  3 mg Oral QHS   pantoprazole  20 mg Oral Daily   Continuous Infusions:  PRN Meds: acetaminophen   Vital Signs    Vitals:   06/29/23 0400 06/29/23 0621 06/29/23 0801 06/29/23 0835  BP:  110/88 113/84 118/86  Pulse:  75 81 84  Resp:  16    Temp:  (!) 97.5 F (36.4 C) (!) 96.8 F (36 C)   TempSrc:  Oral Oral   SpO2:  98% 95% 98%  Weight: 79.2 kg     Height:        Intake/Output Summary (Last 24 hours) at 06/29/2023 1041 Last data filed at 06/29/2023 0802 Gross per 24 hour  Intake 480 ml  Output --  Net 480 ml      06/29/2023    4:00 AM 06/28/2023    6:17 AM 12/27/2022    1:34 PM  Last 3 Weights  Weight (lbs) 174 lb 8 oz 185 lb 195 lb  Weight (kg) 79.153 kg 83.915 kg 88.451 kg      Telemetry    SR  - Personally Reviewed  ECG     - Personally Reviewed  Physical Exam   GEN: No acute distress.   Neck: No JVD Cardiac: RRR, no murmur   No rub Respiratory: Clear to auscultation  GI: Soft, nontender, non-distended  Ex    No edema  2+ PT pulses   Labs    High Sensitivity Troponin:   Recent Labs  Lab 06/28/23 0620 06/28/23 1134 06/28/23 1954  TROPONINIHS 6 7 6      Chemistry Recent Labs  Lab 06/28/23 0620 06/29/23 0344  NA 133* 135  K 3.7 2.8*  CL 96* 100  CO2 27 25  GLUCOSE 127* 124*  BUN 10 18  CREATININE 1.11 1.28*  CALCIUM 9.8 9.1  MG  --  2.0  GFRNONAA >60 >60  ANIONGAP 10 10    Lipids  Recent Labs  Lab 06/28/23 0620  CHOL 218*  TRIG 105  HDL 85  LDLCALC 112*  CHOLHDL 2.6    Hematology Recent Labs  Lab 06/28/23 0620 06/29/23 0344   WBC 9.1 5.4  RBC 4.79 4.72  HGB 15.7 15.4  HCT 44.9 44.1  MCV 93.7 93.4  MCH 32.8 32.6  MCHC 35.0 34.9  RDW 13.5 13.2  PLT 223 199   Thyroid No results for input(s): "TSH", "FREET4" in the last 168 hours.  BNPNo results for input(s): "BNP", "PROBNP" in the last 168 hours.  DDimer  Recent Labs  Lab 06/28/23 0748  DDIMER <0.27     Radiology    CT Chest W Contrast  Result Date: 06/28/2023 CLINICAL DATA:  47 year old male with chest pain and shortness of breath. Smoker. Symptoms exacerbated by lying on left side. EXAM: CT CHEST WITH CONTRAST TECHNIQUE: Multidetector CT imaging of the chest was performed during intravenous contrast administration. RADIATION DOSE REDUCTION: This exam was performed according to the departmental dose-optimization program which includes automated exposure control, adjustment of the mA and/or kV  according to patient size and/or use of iterative reconstruction technique. CONTRAST:  50mL OMNIPAQUE IOHEXOL 350 MG/ML SOLN COMPARISON:  Chest radiographs 0640 hours today. FINDINGS: Cardiovascular: Calcified coronary artery plaque is evident on series 3, image 100. No cardiomegaly. No pericardial effusion. Negative visible aorta. Proximal great vessels appear negative. Insufficient pulmonary artery contrast on this routine exam. Mediastinum/Nodes: Negative. No mediastinal mass or lymphadenopathy. Lungs/Pleura: Major airways are patent. Symmetric dependent atelectasis in the lungs. No pneumothorax or pleural effusion. Right lateral lung base curvilinear opacity on x-ray appears to correspond to this atelectasis or scarring. No consolidation or convincing pulmonary inflammation. No suspicious lung nodule. Upper Abdomen: Negative visible liver, gallbladder, spleen, pancreas, adrenal glands, kidneys (small simple fluid density right renal cyst - no follow-up imaging recommended), and bowel in the upper abdomen. Musculoskeletal: No acute osseous abnormality identified.  IMPRESSION: 1. Symmetric lung base atelectasis and/or scarring. No acute or inflammatory process identified in the Chest. 2. Calcified coronary artery plaque. Electronically Signed   By: Odessa Fleming M.D.   On: 06/28/2023 10:57   DG Chest 2 View  Result Date: 06/28/2023 CLINICAL DATA:  Chest pain in shortness of breath with exertion. EXAM: CHEST - 2 VIEW COMPARISON:  10/01/2019 FINDINGS: The heart size and mediastinal contours are within normal limits. Mild linear scar like opacity within the lateral right base. No pleural effusion, interstitial edema or airspace disease. The visualized skeletal structures are unremarkable. IMPRESSION: 1. No acute cardiopulmonary disease. 2. Mild linear scar like opacity within the lateral right base. Electronically Signed   By: Signa Kell M.D.   On: 06/28/2023 07:06    Cardiac Studies   Echo ordered  CT ordered   Patient Profile     47 y.o. male with hx of  HTN, anxiety, tobacco use   Admitted for CP   Assessment & Plan    1  CP   Atypical for coronary ischemia     When I saw him last night it had resolved   REview of record he CP last night and pt started on colchicine and NSAIDS  ALso on ASA  I have reviewed EKGs and compared to previous EKGs    They are dynamic    Has evid of LVH which makes specificity of conclusions limited  He has had some ST elevation on previous EKG   More consistent with early repolarization There was no PR depression   Last EKG with some PR depression in inf and lateral leads   ESR normal though does not rule out pericarditis   Trop neg x 2  Plan for echo and CCTA today     Dietrich Pates MD   2  Hx HTN    Good control  3 HL    LDL mildly increased    Evid of CAD on chest CT  Statin started   Will follow as ouptpt  4  Tobacco abuse  Counselled on cessation.  Dietrich Pates MD    For questions or updates, please contact [facility] Please consult [email or web address] for contact info under        Signed, Dietrich Pates,  MD  06/29/2023, 10:41 AM

## 2023-06-29 NOTE — Plan of Care (Signed)
Problem: Education: Goal: Knowledge of General Education information will improve Description: Including pain rating scale, medication(s)/side effects and non-pharmacologic comfort measures Outcome: Progressing   Problem: Health Behavior/Discharge Planning: Goal: Ability to manage health-related needs will improve Outcome: Progressing   Problem: Clinical Measurements: Goal: Ability to maintain clinical measurements within normal limits will improve Outcome: Progressing Goal: Will remain free from infection Outcome: Progressing Goal: Diagnostic test results will improve Outcome: Progressing Goal: Respiratory complications will improve Outcome: Progressing Goal: Cardiovascular complication will be avoided Outcome: Progressing   Problem: Activity: Goal: Risk for activity intolerance will decrease Outcome: Progressing   Problem: Nutrition: Goal: Adequate nutrition will be maintained Outcome: Progressing   Problem: Coping: Goal: Level of anxiety will decrease Outcome: Progressing   Problem: Elimination: Goal: Will not experience complications related to bowel motility Outcome: Progressing Goal: Will not experience complications related to urinary retention Outcome: Progressing   Problem: Pain Management: Goal: General experience of comfort will improve Outcome: Progressing   Problem: Safety: Goal: Ability to remain free from injury will improve Outcome: Progressing   Problem: Skin Integrity: Goal: Risk for impaired skin integrity will decrease Outcome: Progressing   Problem: Education: Goal: Understanding of cardiac disease, CV risk reduction, and recovery process will improve Outcome: Progressing Goal: Individualized Educational Video(s) Outcome: Progressing   Problem: Activity: Goal: Ability to tolerate increased activity will improve Outcome: Progressing   Problem: Cardiac: Goal: Ability to achieve and maintain adequate cardiovascular perfusion will  improve Outcome: Progressing   Problem: Health Behavior/Discharge Planning: Goal: Ability to safely manage health-related needs after discharge will improve Outcome: Progressing

## 2023-06-29 NOTE — TOC Initial Note (Signed)
Transition of Care [facility]) - Initial/Assessment Note    Patient Details  Name: Paul Molina MRN: [id number] Date of Birth: September 27, 1975  [facility]) CM/SW Contact:    Leone Haven, RN Phone Number: 06/29/2023, 3:29 PM  Clinical Narrative:                 From home alone, has PCP and insurance on file, states has no HH services in place at this time or DME at home.  States he will get himself a lift ride at discharge which he will pay for himself. Family is support system, states gets medications from [facility] and [facility] [employer].  Pta self ambulatory.  Expected Discharge Plan: Home/Self Care Barriers to Discharge: Continued Medical Work up   Patient Goals and CMS Choice Patient states their goals for this hospitalization and ongoing recovery are:: return home   Choice offered to / list presented to : NA      Expected Discharge Plan and Services In-house Referral: NA Discharge Planning Services: CM Consult Post Acute Care Choice: NA Living arrangements for the past 2 months: Single Family Home                 DME Arranged: N/A DME Agency: NA       HH Arranged: NA          Prior Living Arrangements/Services Living arrangements for the past 2 months: Single Family Home Lives with:: Self Patient language and need for interpreter reviewed:: Yes Do you feel safe going back to the place where you live?: Yes      Need for Family Participation in Patient Care: No (Comment) Care giver support system in place?: Yes (comment)   Criminal Activity/Legal Involvement Pertinent to Current Situation/Hospitalization: No - Comment as needed  Activities of Daily Living   ADL Screening (condition at time of admission) Independently performs ADLs?: Yes (appropriate for developmental age) Is the patient deaf or have difficulty hearing?: No Does the patient have difficulty seeing, even when wearing glasses/contacts?: No Does the patient have difficulty  concentrating, remembering, or making decisions?: No  Permission Sought/Granted Permission sought to share information with : Case Manager Permission granted to share information with : Yes, Verbal Permission Granted              Emotional Assessment Appearance:: Appears stated age Attitude/Demeanor/Rapport: Engaged Affect (typically observed): Appropriate Orientation: : Oriented to Self, Oriented to Place, Oriented to  Time, Oriented to Situation Alcohol / Substance Use: Not Applicable Psych Involvement: No (comment)  Admission diagnosis:  Myocardial ischemia [I25.9] T wave inversion in EKG [R94.31] Chest pain [R07.9] Chest pain, unspecified type [R07.9] Patient Active Problem List   Diagnosis Date Noted   Left ventricular hypertrophy 06/29/2023   Hyperlipidemia with target LDL less than 70 06/29/2023   Chest pain 06/28/2023   Essential hypertension 10/13/2021   Tobacco use 10/02/2019   PCP:  Claiborne Rigg, NP Pharmacy:   [facility] [address], [address] [place] [address] Phone: [phone] Fax: [phone]     Social Determinants of Health (SDOH) Social History: SDOH Screenings   Food Insecurity: No Food Insecurity (06/28/2023)  Housing: Low Risk  (06/28/2023)  Transportation Needs: No Transportation Needs (06/28/2023)  Utilities: Not At Risk (06/28/2023)  Alcohol Screen: Low Risk  (12/23/2022)  Depression (PHQ2-9): Low Risk  (12/27/2022)  Financial Resource Strain: High Risk (12/23/2022)  Physical Activity: Unknown (12/23/2022)  Social Connections: Socially Isolated (12/23/2022)  Stress: No Stress Concern Present (12/23/2022)  Tobacco Use: High Risk (06/28/2023)   SDOH Interventions:     Readmission Risk Interventions     No data to display

## 2023-06-29 NOTE — Plan of Care (Signed)
?  Problem: Education: ?Goal: Understanding of cardiac disease, CV risk reduction, and recovery process will improve ?Outcome: Progressing ?  ?Problem: Cardiac: ?Goal: Ability to achieve and maintain adequate cardiovascular perfusion will improve ?Outcome: Progressing ?  ?Problem: Health Behavior/Discharge Planning: ?Goal: Ability to safely manage health-related needs after discharge will improve ?Outcome: Progressing ?  ?

## 2023-06-29 NOTE — Progress Notes (Addendum)
Summary: [name] is a 47 year old with HTN and hyperlipidemia who presented with substernal non-radiating chest pain. He was admitted to the Internal Medicine Teaching Service for further work up and characterization of chest pain.   Subjective:  Patient's chest pain worsened overnight, EKG and symptoms concerning for pericarditis. Overnight team initiated treatment with NSAIDs, colchicine, and PPI.   This morning the patient states he is feeling much better, denies any chest pain currently. States he experienced similar pain 3 years ago, but at that time his admission diagnosis was hypertensive urgency. Also denies any CP, SOB, N/V, or issues with voiding. Ate breakfast and is tolerating liquids and PO medications. Appears alert, engaged, and expressed understanding of treatment plan for today.   Objective:  Vital signs in last 24 hours: Vitals:   06/29/23 0400 06/29/23 0621 06/29/23 0801 06/29/23 0835  BP:  110/88 113/84 118/86  Pulse:  75 81 84  Resp:  16    Temp:  (!) 97.5 F (36.4 C) (!) 96.8 F (36 C)   TempSrc:  Oral Oral   SpO2:  98% 95% 98%  Weight: 79.2 kg     Height:       CBC    Component Value Date/Time   WBC 5.4 06/29/2023 0344   RBC 4.72 06/29/2023 0344   HGB 15.4 06/29/2023 0344   HGB 16.2 12/27/2022 1508   HCT 44.1 06/29/2023 0344   HCT 47.1 12/27/2022 1508   PLT 199 06/29/2023 0344   PLT 310 12/27/2022 1508   MCV 93.4 06/29/2023 0344   MCV 94 12/27/2022 1508   MCH 32.6 06/29/2023 0344   MCHC 34.9 06/29/2023 0344   RDW 13.2 06/29/2023 0344   RDW 14.4 12/27/2022 1508   LYMPHSABS 1.9 12/27/2022 1508   EOSABS 0.1 12/27/2022 1508   BASOSABS 0.0 12/27/2022 1508      Latest Ref Rng & Units 06/29/2023    3:44 AM 06/28/2023    6:20 AM 12/27/2022    3:08 PM  CMP  Glucose 70 - 99 mg/dL 696  295  82   BUN 6 - 20 mg/dL 18  10  16    Creatinine 0.61 - 1.24 mg/dL 2.84  1.32  4.40   Sodium 135 - 145 mmol/L 135  133  139   Potassium 3.5 - 5.1 mmol/L 2.8   3.7  3.3   Chloride 98 - 111 mmol/L 100  96  97   CO2 22 - 32 mmol/L 25  27  22    Calcium 8.9 - 10.3 mg/dL 9.1  9.8  10.2   Total Protein 6.0 - 8.5 g/dL   8.0   Total Bilirubin 0.0 - 1.2 mg/dL   0.5   Alkaline Phos 44 - 121 IU/L   70   AST 0 - 40 IU/L   31   ALT 0 - 44 IU/L   43     Sed rate 6 (WNL) CRP 8.3 (elevated) Troponins 6, 7, 6 (peaked)  Physical Exam Constitutional: Patient is resting comfortably in bed in no acute distress.  CV: Regular rate and rhythm without murmurs on auscultation. Negative for friction rub. No elevated JVP or LE edema.  Pulmonary/Respiratory: Normal respiratory effort on room air.  Skin: Warm and dry. Psych: Normal mood and affect.   EKG 11/21 NSR, LVH, Qtc 402 ms, PR depressions  Assessment/Plan:  Active Problems:   Chest pain  Paul Molina is a 47 yo person living with a history of hypertension and hyperlipidemia who  presented with substernal chest pain and admitted for chest pain.   Chest pain Patient's symptoms, including worsening of chest pain on deep inspiration, persistent nature of chest pain, and improvement in symptoms leaning forward consistent with pericarditis. However, no history of recent illness, EKG changes not entirely consistent with classic pericarditis, and no evidence of LE edema or friction rub on exam. Patient started on treatment for pericarditis with improvement in chest pain. Will continue to evaluate other cardiac causes including echo and coronary CTA today. Cardiology following, appreciate their recommendations.  Plan: -Continue ibuprofen 600 mg TID, colchicine 0.6 mg BID, and pantoprazole 20 mg daily  -Determine if aspirin 81 mg is needed, continue for now  -Follow up on echocardiogram and coronary CTA -EKG as needed if chest pain worsens with STAT troponin -Avoid nitroglycerin (lowered BP significantly)  Hypokalemia Patient's morning K 2.8, repleted with PO potassium chloride 40 mEq x 2 doses. Follow up on PM K  to help determine need for further repletion.   Hypertension Home medications include amlodipine 10 mg, chlorthalidone 25 mg, and valsartan 80 mg. BP 110/88, will continue to hold home medications.   Hyperlipidemia Chronic. Lipid panel on admission with total cholesterol of 218 and LDL 112. ASCVD risk 10.1%. -Continue atorvastatin 80 mg  Tobacco use Patient with 30 year smoking history, smokes about 3 cigarettes a day. Will counsel on smoking cessation while inpatient and medication that can aid in cessation (ie NRT or chantix) with outpatient follow up.   Prior to Admission Living Arrangement: Home Anticipated Discharge Location: Home Barriers to Discharge: Medical management Dispo: Anticipated discharge in approximately 1-2 day(s).   Philomena Doheny, MD, PGY-1 06/29/2023, 9:13 AM Pager: [phone] After 5pm on weekdays and 1pm on weekends: On Call pager [phone]

## 2023-06-29 NOTE — Progress Notes (Signed)
   Echo done today   LVEF/ RVEF normal    Read as moderate LVH   CCTA: Ca score   312 (99%ile for age, sex, not 48th percentile  78 is his age)  1. Coronary calcium score of 312 . This was 48th percentile for age-, sex, and race-matched controls.   2. Total plaque volume (TPV) 376 mm3 which is 84th percentile for age- and sex-matched controls (calcified plaque 57 mm3; non-calcified plaque 319 mm3 (low attenuation plaque 15 mm3)). 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   3.  Normal coronary origin with Left dominance.   4.  CAD-RADS = 3.   Left Main: Minimal stenosis (0-24%) due to mixed plaque at the distal segment.   LAD: Moderate disease noted within the proximal & mid segments. Distal to apical segments patent. See above for details.   First and Second Diagonal branches: Moderate disease. See above for details.   LCX: Dominant, patent.   RCA: Patent.   5. Study is sent for CT-FFR to further evaluate the LAD and Diagonal distribution. Findings will be performed and reported separately.   RECOMMENDATIONS:   Consider symptom-guided anti-ischemic pharmacotherapy as well as risk factor modification per guideline directed care. Additional analysis with CT FFR will be submitted.     1. Left Main: FFR = 0.99   2. LAD: Proximal FFR = 0.96, mid FFR = 0.77, distal FFR = 0.73 3. D1: Proximal FFR = not modeled 4. D2: Proximal FFR = 0.93, distal FFR = 0.84 5. LCX: Proximal FFR = 0.98, distal FFR = 0.90 6. Ramus: Not modeled. 7. RCA: Proximal FFR = 0.99, mid FFR =not modeled, distal FFR = not modeled   IMPRESSION: 1. CT FFR analysis showed no significant stenosis within the left main, LCX, proximal RCA.   2. CT FFR suggestive of hemodynamically significant stenosis in proximal to mid LAD due to the translation difference between the segments (FFR valves are in the indeterminate zone).   RECOMMENDATIONS: Goal directed medical therapy (up titration of anti-anginal and statin therapies) and  aggressive risk factor modification for secondary prevention of coronary artery disease is strongly recommended.   If refractory to medical therapy and/or based on clinical judgment invasive angiography for further evaluation could be considered.   Findings discussed with attending cardiologist Dr. Tenny Craw.      I have reviewed results with patient    His symptoms were atypical but CT /FFR findings concerning in LAD   Esp given age  I would recomm LHC to evaluate anatomy  Risks/ benefits described   Pt reflecting   OVerall seems surprised     Told him someone would be by tomorrow to review    I would stop ibuprofen   Watch on colchicine for loose BM   SHould not hurt otherwise   I do not think pericarditis.    Keep on asa and atorvastatin   Signed, Dietrich Pates, MD  06/29/2023, 7:08 PM

## 2023-06-30 DIAGNOSIS — E785 Hyperlipidemia, unspecified: Secondary | ICD-10-CM | POA: Diagnosis not present

## 2023-06-30 DIAGNOSIS — R079 Chest pain, unspecified: Secondary | ICD-10-CM | POA: Diagnosis not present

## 2023-06-30 DIAGNOSIS — F17210 Nicotine dependence, cigarettes, uncomplicated: Secondary | ICD-10-CM | POA: Diagnosis not present

## 2023-06-30 DIAGNOSIS — I259 Chronic ischemic heart disease, unspecified: Secondary | ICD-10-CM | POA: Diagnosis not present

## 2023-06-30 DIAGNOSIS — I10 Essential (primary) hypertension: Secondary | ICD-10-CM | POA: Diagnosis not present

## 2023-06-30 DIAGNOSIS — R9431 Abnormal electrocardiogram [ECG] [EKG]: Secondary | ICD-10-CM

## 2023-06-30 DIAGNOSIS — E876 Hypokalemia: Secondary | ICD-10-CM | POA: Diagnosis not present

## 2023-06-30 LAB — BASIC METABOLIC PANEL
Anion gap: 11 (ref 5–15)
BUN: 17 mg/dL (ref 6–20)
CO2: 23 mmol/L (ref 22–32)
Calcium: 9 mg/dL (ref 8.9–10.3)
Chloride: 103 mmol/L (ref 98–111)
Creatinine, Ser: 1.06 mg/dL (ref 0.61–1.24)
GFR, Estimated: >60 mL/min (ref >60)
Glucose, Bld: 86 mg/dL (ref 70–99)
Potassium: 3.4 mmol/L — ABNORMAL LOW (ref 3.5–5.1)
Sodium: 137 mmol/L (ref 135–145)

## 2023-06-30 MED ORDER — POTASSIUM CHLORIDE CRYS ER 20 MEQ PO TBCR
40.0000 meq | EXTENDED_RELEASE_TABLET | Freq: Once | ORAL | Status: AC
  Administered 2023-06-30: 40 meq via ORAL
  Filled 2023-06-30 (×2): qty 2

## 2023-06-30 NOTE — Progress Notes (Signed)
Rounding Note    Patient Name: Yechiel Myklebust Date of Encounter: 06/30/2023  [facility] Cardiologist:  New   Subjective   Denies CP today  Breathing is OK   Inpatient Medications    Scheduled Meds:  aspirin  81 mg Oral Daily   atorvastatin  80 mg Oral Daily   colchicine  0.6 mg Oral BID   heparin  5,000 Units Subcutaneous Q8H   melatonin  3 mg Oral QHS   pantoprazole  20 mg Oral Daily   Continuous Infusions:  PRN Meds: acetaminophen   Vital Signs    Vitals:   06/29/23 2018 06/30/23 0008 06/30/23 0420 06/30/23 0737  BP: 118/81 114/81 122/79 111/84  Pulse: 65 69 61 68  Resp: 17 16 17 16   Temp: 98.2 F (36.8 C) 98 F (36.7 C) 97.6 F (36.4 C) 97.7 F (36.5 C)  TempSrc: Oral Oral Oral Oral  SpO2: 98% 100% 98% 100%  Weight:   80.3 kg   Height:        Intake/Output Summary (Last 24 hours) at 06/30/2023 0958 Last data filed at 06/29/2023 2200 Gross per 24 hour  Intake 120 ml  Output --  Net 120 ml      06/30/2023    4:20 AM 06/29/2023    4:00 AM 06/28/2023    6:17 AM  Last 3 Weights  Weight (lbs) 177 lb 0.5 oz 174 lb 8 oz 185 lb  Weight (kg) 80.3 kg 79.153 kg 83.915 kg      Telemetry    SR  - Personally Reviewed  ECG     SR, RAD - Personally Reviewed  Physical Exam   GEN: No acute distress.   Neck: No JVD Cardiac: RRR, no murmur   No rub Respiratory: Clear to auscultation  GI: Soft, nontender, non-distended  Ex    No edema  2+ PT pulses   Labs    High Sensitivity Troponin:   Recent Labs  Lab 06/28/23 0620 06/28/23 1134 06/28/23 1954  TROPONINIHS 6 7 6      Chemistry Recent Labs  Lab 06/28/23 0620 06/29/23 0344 06/29/23 1111 06/30/23 0243  NA 133* 135  --  137  K 3.7 2.8* 3.7 3.4*  CL 96* 100  --  103  CO2 27 25  --  23  GLUCOSE 127* 124*  --  86  BUN 10 18  --  17  CREATININE 1.11 1.28*  --  1.06  CALCIUM 9.8 9.1  --  9.0  MG  --  2.0  --   --   GFRNONAA >60 >60  --  >60  ANIONGAP 10 10  --  11     Lipids  Recent Labs  Lab 06/28/23 0620  CHOL 218*  TRIG 105  HDL 85  LDLCALC 112*  CHOLHDL 2.6    Hematology Recent Labs  Lab 06/28/23 0620 06/29/23 0344  WBC 9.1 5.4  RBC 4.79 4.72  HGB 15.7 15.4  HCT 44.9 44.1  MCV 93.7 93.4  MCH 32.8 32.6  MCHC 35.0 34.9  RDW 13.5 13.2  PLT 223 199   Thyroid No results for input(s): "TSH", "FREET4" in the last 168 hours.  BNPNo results for input(s): "BNP", "PROBNP" in the last 168 hours.  DDimer  Recent Labs  Lab 06/28/23 0748  DDIMER <0.27     Radiology    ECHOCARDIOGRAM COMPLETE  Result Date: 06/29/2023    ECHOCARDIOGRAM REPORT   Patient Name:   Leonides Schanz Date of Exam:  06/29/2023 Medical Rec #:  [id number]      Height:       72.0 in Accession #:    [id number]     Weight:       174.5 lb Date of Birth:  1976-02-22      BSA:          2.011 m Patient Age:    47 years       BP:           118/86 mmHg Patient Gender: M              HR:           73 bpm. Exam Location:  Inpatient Procedure: 2D Echo, Cardiac Doppler and Color Doppler Indications:    CP  History:        Patient has prior history of Echocardiogram examinations, most                 recent 10/02/2019. Risk Factors:Hypertension.  Sonographer:    Vern Claude Referring Phys: Kelton Pillar GOODRICH IMPRESSIONS  1. Left ventricular ejection fraction, by estimation, is 55 to 60%. The left ventricle has normal function. The left ventricle has no regional wall motion abnormalities. There is moderate concentric left ventricular hypertrophy. Left ventricular diastolic parameters were normal.  2. Right ventricular systolic function is normal. The right ventricular size is normal. There is normal pulmonary artery systolic pressure.  3. The mitral valve is normal in structure. Trivial mitral valve regurgitation. No evidence of mitral stenosis.  4. The aortic valve is tricuspid. Aortic valve regurgitation is not visualized. No aortic stenosis is present.  5. The inferior vena cava is normal in  size with greater than 50% respiratory variability, suggesting right atrial pressure of 3 mmHg. Comparison(s): No significant change from prior study. Conclusion(s)/Recommendation(s): Otherwise normal echocardiogram, with minor abnormalities described in the report. FINDINGS  Left Ventricle: Left ventricular ejection fraction, by estimation, is 55 to 60%. The left ventricle has normal function. The left ventricle has no regional wall motion abnormalities. The left ventricular internal cavity size was normal in size. There is  moderate concentric left ventricular hypertrophy. Left ventricular diastolic parameters were normal. Right Ventricle: The right ventricular size is normal. Right vetricular wall thickness was not well visualized. Right ventricular systolic function is normal. There is normal pulmonary artery systolic pressure. The tricuspid regurgitant velocity is 2.24 m/s, and with an assumed right atrial pressure of 3 mmHg, the estimated right ventricular systolic pressure is 23.1 mmHg. Left Atrium: Left atrial size was normal in size. Right Atrium: Right atrial size was normal in size. Pericardium: There is no evidence of pericardial effusion. Mitral Valve: The mitral valve is normal in structure. Trivial mitral valve regurgitation. No evidence of mitral valve stenosis. MV peak gradient, 2.1 mmHg. The mean mitral valve gradient is 1.0 mmHg. Tricuspid Valve: The tricuspid valve is normal in structure. Tricuspid valve regurgitation is trivial. No evidence of tricuspid stenosis. Aortic Valve: The aortic valve is tricuspid. Aortic valve regurgitation is not visualized. No aortic stenosis is present. Aortic valve mean gradient measures 2.0 mmHg. Aortic valve peak gradient measures 3.0 mmHg. Aortic valve area, by VTI measures 2.67 cm. Pulmonic Valve: The pulmonic valve was not well visualized. Pulmonic valve regurgitation is not visualized. No evidence of pulmonic stenosis. Aorta: The aortic root, ascending aorta  and aortic arch are all structurally normal, with no evidence of dilitation or obstruction. Venous: The inferior vena cava is normal in size with greater than  50% respiratory variability, suggesting right atrial pressure of 3 mmHg. IAS/Shunts: The atrial septum is grossly normal.  LEFT VENTRICLE PLAX 2D LVIDd:         4.30 cm      Diastology LVIDs:         2.40 cm      LV e' medial:    5.35 cm/s LV PW:         1.40 cm      LV E/e' medial:  10.4 LV IVS:        1.60 cm      LV e' lateral:   7.22 cm/s LVOT diam:     2.00 cm      LV E/e' lateral: 7.7 LV SV:         44 LV SV Index:   22 LVOT Area:     3.14 cm  LV Volumes (MOD) LV vol d, MOD A2C: 112.0 ml LV vol d, MOD A4C: 107.0 ml LV vol s, MOD A2C: 52.5 ml LV vol s, MOD A4C: 42.6 ml LV SV MOD A2C:     59.5 ml LV SV MOD A4C:     107.0 ml LV SV MOD BP:      66.5 ml RIGHT VENTRICLE            IVC RV Basal diam:  3.30 cm    IVC diam: 1.50 cm RV Mid diam:    2.80 cm RV S prime:     7.70 cm/s TAPSE (M-mode): 1.8 cm LEFT ATRIUM             Index        RIGHT ATRIUM           Index LA Vol (A2C):   42.6 ml 21.19 ml/m  RA Area:     13.50 cm LA Vol (A4C):   29.7 ml 14.77 ml/m  RA Volume:   32.70 ml  16.26 ml/m LA Biplane Vol: 37.0 ml 18.40 ml/m  AORTIC VALVE                    PULMONIC VALVE AV Area (Vmax):    2.70 cm     PV Vmax:       0.65 m/s AV Area (Vmean):   2.40 cm     PV Peak grad:  1.7 mmHg AV Area (VTI):     2.67 cm AV Vmax:           86.20 cm/s AV Vmean:          63.300 cm/s AV VTI:            0.166 m AV Peak Grad:      3.0 mmHg AV Mean Grad:      2.0 mmHg LVOT Vmax:         74.20 cm/s LVOT Vmean:        48.300 cm/s LVOT VTI:          0.141 m LVOT/AV VTI ratio: 0.85  AORTA Ao Root diam: 3.10 cm Ao Asc diam:  3.20 cm MITRAL VALVE               TRICUSPID VALVE MV Area (PHT): 2.20 cm    TR Peak grad:   20.1 mmHg MV Area VTI:   2.15 cm    TR Vmax:        224.00 cm/s MV Peak grad:  2.1 mmHg MV Mean grad:  1.0 mmHg    SHUNTS MV Vmax:  0.72 m/s    Systemic  VTI:  0.14 m MV Vmean:      44.8 cm/s   Systemic Diam: 2.00 cm MV Decel Time: 345 msec MV E velocity: 55.80 cm/s MV A velocity: 78.80 cm/s MV E/A ratio:  0.71 Jodelle Red MD Electronically signed by Jodelle Red MD Signature Date/Time: 06/29/2023/4:51:05 PM    Final    CT CORONARY FFR DATA PREP & FLUID ANALYSIS  Result Date: 06/29/2023 EXAM: CT FFR ANALYSIS CLINICAL DATA:  [id number] Chest pain [id number] FINDINGS: FFRct analysis was performed on the original cardiac CT angiogram dataset. Diagrammatic representation of the FFRct analysis is provided in a separate PDF document in PACS. This dictation was created using the PDF document and an interactive 3D model of the results. 3D model is not available in the EMR/PACS. Normal FFR range is >0.80. Indeterminate (grey) zone is 0.76-0.80. 1. Left Main: FFR = 0.99 2. LAD: Proximal FFR = 0.96, mid FFR = 0.77, distal FFR = 0.73 3. D1: Proximal FFR = not modeled 4. D2: Proximal FFR = 0.93, distal FFR = 0.84 5. LCX: Proximal FFR = 0.98, distal FFR = 0.90 6. Ramus: Not modeled. 7. RCA: Proximal FFR = 0.99, mid FFR =not modeled, distal FFR = not modeled IMPRESSION: 1. CT FFR analysis showed no significant stenosis within the left main, LCX, proximal RCA. 2. CT FFR suggestive of hemodynamically significant stenosis in proximal to mid LAD due to the translation difference between the segments (FFR valves are in the indeterminate zone). RECOMMENDATIONS: Goal directed medical therapy (up titration of anti-anginal and statin therapies) and aggressive risk factor modification for secondary prevention of coronary artery disease is strongly recommended. If refractory to medical therapy and/or based on clinical judgment invasive angiography for further evaluation could be considered. Findings discussed with attending cardiologist Dr. Tenny Craw. Electronically Signed   By: Tessa Lerner D.O.   On: 06/29/2023 15:56   CT CORONARY MORPH W/CTA COR W/SCORE W/CA W/CM &/OR  WO/CM  Result Date: 06/29/2023 HISTORY: Chest pain, nonspecific EXAM: Cardiac/Coronary  CT TECHNIQUE: The patient was scanned on a [employer]. PROTOCOL: A non-contrast, gated CT scan was obtained with axial slices of 3 mm through the heart for calcium scoring. Calcium scoring was performed using the Agatston method. A 120 kV prospective, gated, contrast cardiac scan was obtained. Gantry rotation speed was 250 msecs and collimation was 0.6 mm. Two sublingual nitroglycerin tablets (0.8 mg) were given. The 3D data set was reconstructed in 5% intervals of the 35-75% of the R-R cycle. Diastolic phases were analyzed on a dedicated workstation using MPR, MIP, and VRT modes. The patient received 95 cc of contrast. FINDINGS: Image quality: Average. Artifact: Limited. Coronary artery calcification score: Left main: 29.5 Left anterior descending artery: 282 Left circumflex artery: 0 Right coronary artery: 0.7 Total coronary calcium score of 312, places the patient at the 99th percentile for age and sex matched control. Coronary arteries: Normal coronary origins.  Left dominance. Left Main Coronary Artery: Normal caliber vessel, originates from the left coronary cusp, trifurcates to form a left anterior descending artery (LAD) left circumflex artery (LCX), and ramus intermedius. Proximal to mid segment patent. Minimal stenosis (0-24%) due to mixed plaque at the distal segment. Left Anterior Descending Artery: Normal caliber vessel, wraps the apex, gives off 2 diagonal branches. Moderate stenosis (50-69%) due to mixed plaque at the proximal LAD (at the first diagonal take off). Moderate stenosis (50-69%) due to mixed plaque at the mid LAD. Distal to apical segments  patent. First Diagonal branch: Normal caliber vessel, bifurcates into superior and inferior branches. Moderate stenosis (50-69%) due to calcified plaque at the ostial to proximal segment. Second Diagonal branch: Normal caliber vessel. Moderate stenosis  (50-69%) closer to 50% due to calcified plaque at the proximal segment and remainder of the vessel is patent. Left Circumflex Artery: Normal caliber vessel, dominant, travels within the atrioventricular groove, gives off 3 obtuse marginal branches, terminates as a L-PDA and posterolateral branch. The LCX is patent with no evidence of plaque or stenosis. First Obtuse Marginal branch: Normal caliber, large vessel, bifurcates into superior and inferior branches, supplies the lateral wall, patent. Second Obtuse Marginal branch: Small, patent. Third Obtuse Marginal branch: Normal size, patent. Ramus intermedius: Small size, patent. Right Coronary Artery: Non-dominant vessel, originates from the right coronary cusp, with minimal luminal irregularities. Left Atrium: Grossly normal in size with no left atrial appendage filling defect. Left Ventricle: Grossly normal in size. There are no stigmata of prior infarction. There is no abnormal filling defect. Pulmonary arteries: Normal in size without proximal filling defect. Pulmonary veins: Normal pulmonary venous drainage. Aorta: Normal size, 33.4 mm at the mid ascending aorta (level of the PA bifurcation) measured double oblique. No calcifications. No dissection. Pericardium: Normal thickness with no significant effusion or calcium present. Aortic Valve:Trileaflet without calcification. Mitral Valve: Normal structure without calcification. Extra-cardiac findings: See attached radiology report for non-cardiac structures. IMPRESSION: 1. Coronary calcium score of 312 . This was 48th percentile for age-, sex, and race-matched controls. 2. Total plaque volume (TPV) 376 mm3 which is 84th percentile for age- and sex-matched controls (calcified plaque 57 mm3; non-calcified plaque 319 mm3 (low attenuation plaque 15 mm3)). 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 3.  Normal coronary origin with Left dominance. 4.  CAD-RADS = 3. Left Main: Minimal stenosis (0-24%) due to mixed plaque at the distal segment. LAD:  Moderate disease noted within the proximal & mid segments. Distal to apical segments patent. See above for details. First and Second Diagonal branches: Moderate disease. See above for details. LCX: Dominant, patent. RCA: Patent. 5. Study is sent for CT-FFR to further evaluate the LAD and Diagonal distribution. Findings will be performed and reported separately. RECOMMENDATIONS: Consider symptom-guided anti-ischemic pharmacotherapy as well as risk factor modification per guideline directed care. Additional analysis with CT FFR will be submitted. Electronically Signed   By: Tessa Lerner D.O.   On: 06/29/2023 15:43    Cardiac Studies   Echo ordered  CT ordered   Patient Profile     47 y.o. male with hx of  HTN, anxiety, tobacco use   Admitted for CP   Assessment & Plan    CP    Atypical for coronary ischemia     Review of record he CP and pt started on colchicine and NSAIDS  Also on ASA EKGs show some dynamic changes, obscured though by LVH Coronary Ca++ score at 99%ile;  CT FFR suggestive of significant prox-mid LAD lesion Plan for coronary angiogram Monday. I discussed with the patient. Make NPO after midnight tomorrow  I have reviewed EKGs and compared to previous EKGs    They are dynamic    Has evid of LVH which makes specificity of conclusions limited  He has had some ST elevation on previous EKG   More consistent with early repolarization There was no PR depression   Last EKG with some PR depression in inf and lateral leads   ESR normal though does not rule out pericarditis   Trop neg x 2  Hx HTN     Good control  HL     LDL mildly increased     Evidence of CAD on chest CT   Statin started    Will follow as ouptpt  Tobacco abuse   Counselled on cessation.     For questions or updates, please contact [facility] Please consult [email or web address] for contact info under        Signed, Maurice Small, MD  06/30/2023, 9:58 AM

## 2023-06-30 NOTE — Progress Notes (Signed)
Summary: Paul Molina is a 47 year old with HTN and hyperlipidemia who presented with substernal non-radiating chest pain. He was admitted to the Internal Medicine Teaching Service for further work up and characterization of chest pain.   Subjective:  Continues to be chest pain-free and does not have any other new or worsening issues.  He talked to the cardiologist last night about the results of his coronary CT which showed some suspicion for an LAD lesion.  He understands that he will go for left heart cath on Monday.  We talked about the possible outcomes including continued medical management, stent placement, and CABG.  He understands and all questions were answered.  Objective:  Vital signs in last 24 hours: Vitals:   06/29/23 2018 06/30/23 0008 06/30/23 0420 06/30/23 0737  BP: 118/81 114/81 122/79 111/84  Pulse: 65 69 61 68  Resp: 17 16 17 16   Temp: 98.2 F (36.8 C) 98 F (36.7 C) 97.6 F (36.4 C) 97.7 F (36.5 C)  TempSrc: Oral Oral Oral Oral  SpO2: 98% 100% 98% 100%  Weight:   80.3 kg   Height:       CBC    Component Value Date/Time   WBC 5.4 06/29/2023 0344   RBC 4.72 06/29/2023 0344   HGB 15.4 06/29/2023 0344   HGB 16.2 12/27/2022 1508   HCT 44.1 06/29/2023 0344   HCT 47.1 12/27/2022 1508   PLT 199 06/29/2023 0344   PLT 310 12/27/2022 1508   MCV 93.4 06/29/2023 0344   MCV 94 12/27/2022 1508   MCH 32.6 06/29/2023 0344   MCHC 34.9 06/29/2023 0344   RDW 13.2 06/29/2023 0344   RDW 14.4 12/27/2022 1508   LYMPHSABS 1.9 12/27/2022 1508   EOSABS 0.1 12/27/2022 1508   BASOSABS 0.0 12/27/2022 1508      Latest Ref Rng & Units 06/30/2023    2:43 AM 06/29/2023   11:11 AM 06/29/2023    3:44 AM  CMP  Glucose 70 - 99 mg/dL 86   564   BUN 6 - 20 mg/dL 17   18   Creatinine 3.32 - 1.24 mg/dL 9.51   8.84   Sodium 166 - 145 mmol/L 137   135   Potassium 3.5 - 5.1 mmol/L 3.4  3.7  2.8   Chloride 98 - 111 mmol/L 103   100   CO2 22 - 32 mmol/L 23   25   Calcium 8.9 - 10.3  mg/dL 9.0   9.1     Sed rate 6 (WNL) CRP 8.3 (elevated) Troponins 6, 7, 6 (peaked)  Physical Exam Constitutional: Patient is resting comfortably in bed in no acute distress.  CV: Regular rate and rhythm. LE edema.  Pulmonary/Respiratory: Normal respiratory effort on room air.  Skin: Warm and dry. Psych: Normal mood and affect.   Assessment/Plan:  Active Problems:   Tobacco use   Essential hypertension   Chest pain   Left ventricular hypertrophy   Hyperlipidemia with target LDL less than 70  Paul Molina is a 47 yo person living with a history of hypertension and hyperlipidemia who presented with substernal chest pain and admitted for chest pain.   Cardiac chest pain TTE yesterday with moderate concentric LVH but otherwise no abnormalities and normal EF.  Coronary CT with FFR had findings concerning in the LAD.  Cardiology recommending left heart cath.  Lowering suspicion for pericarditis and ibuprofen discontinued.  Still on colchicine and we will clarify if we will continue to treat for pericarditis  which is a 20-month course. Plan: -Continue colchicine 0.6 mg BID, and pantoprazole 20 mg daily  -Continue aspirin and statin -Avoid nitroglycerin (lowered BP significantly)  Hypokalemia Patient's morning K 3.4, repleted.   Hypertension Blood pressure remains stable off home medications.  Home medications include amlodipine 10 mg, chlorthalidone 25 mg, and valsartan 80 mg.   Hyperlipidemia Chronic. Lipid panel on admission with total cholesterol of 218 and LDL 112. ASCVD risk 10.1%. -Continue atorvastatin 80 mg  Tobacco use Patient with 30 year smoking history, smokes about 3 cigarettes a day. Will counsel on smoking cessation while inpatient and medication that can aid in cessation (ie NRT or chantix) with outpatient follow up.   Prior to Admission Living Arrangement: Home Anticipated Discharge Location: Home Barriers to Discharge: Medical management Dispo: Anticipated  discharge in approximately 2-3 day(s).   Paul Morel, DO, PGY-2 06/30/2023, 9:17 AM Pager: [phone] After 5pm on weekdays and 1pm on weekends: On Call pager [phone]

## 2023-07-01 DIAGNOSIS — F17210 Nicotine dependence, cigarettes, uncomplicated: Secondary | ICD-10-CM | POA: Diagnosis not present

## 2023-07-01 DIAGNOSIS — R9431 Abnormal electrocardiogram [ECG] [EKG]: Secondary | ICD-10-CM | POA: Diagnosis not present

## 2023-07-01 DIAGNOSIS — I259 Chronic ischemic heart disease, unspecified: Secondary | ICD-10-CM

## 2023-07-01 DIAGNOSIS — R079 Chest pain, unspecified: Secondary | ICD-10-CM | POA: Diagnosis not present

## 2023-07-01 DIAGNOSIS — I10 Essential (primary) hypertension: Secondary | ICD-10-CM | POA: Diagnosis not present

## 2023-07-01 DIAGNOSIS — E876 Hypokalemia: Secondary | ICD-10-CM | POA: Diagnosis not present

## 2023-07-01 DIAGNOSIS — E785 Hyperlipidemia, unspecified: Secondary | ICD-10-CM | POA: Diagnosis not present

## 2023-07-01 MED ORDER — SODIUM CHLORIDE 0.9 % WEIGHT BASED INFUSION: 1.0000 mL/kg/h | INTRAVENOUS | Status: DC

## 2023-07-01 MED ORDER — ASPIRIN 81 MG PO CHEW: 81.0000 mg | CHEWABLE_TABLET | Freq: Every day | ORAL | Status: DC

## 2023-07-01 MED ORDER — SODIUM CHLORIDE 0.9 % WEIGHT BASED INFUSION
3.0000 mL/kg/h | INTRAVENOUS | Status: DC
  Administered 2023-07-02: 3 mL/kg/h via INTRAVENOUS

## 2023-07-01 MED ORDER — ASPIRIN 81 MG PO CHEW
81.0000 mg | CHEWABLE_TABLET | ORAL | Status: AC
  Administered 2023-07-02: 81 mg via ORAL
  Filled 2023-07-01: qty 1

## 2023-07-01 NOTE — Progress Notes (Signed)
Summary: Mr. Bickhart is a 47 year old with HTN and hyperlipidemia who presented with substernal non-radiating chest pain. He was admitted to the Internal Medicine Teaching Service for further work up and characterization of chest pain.   Subjective:  Day #4. Patient alert, conversing appropriately with team. Patient slept well, denies any CP or other complaint this morning. One of his children is expected to come visit today. Answered patient questions related to [facility] tomorrow, patient aware he will be NPO at midnight.   Objective:  Vital signs in last 24 hours: Vitals:   06/30/23 1523 06/30/23 1957 07/01/23 0122 07/01/23 0426  BP: 125/89 (!) 124/91 128/80 126/82  Pulse: 70 70 69 72  Resp: 19 19    Temp: 97.8 F (36.6 C) 98 F (36.7 C) 97.8 F (36.6 C) 97.9 F (36.6 C)  TempSrc: Oral Oral Oral Oral  SpO2: 97% 97% 100% 100%  Weight:    81.3 kg  Height:       CBC    Component Value Date/Time   WBC 5.4 06/29/2023 0344   RBC 4.72 06/29/2023 0344   HGB 15.4 06/29/2023 0344   HGB 16.2 12/27/2022 1508   HCT 44.1 06/29/2023 0344   HCT 47.1 12/27/2022 1508   PLT 199 06/29/2023 0344   PLT 310 12/27/2022 1508   MCV 93.4 06/29/2023 0344   MCV 94 12/27/2022 1508   MCH 32.6 06/29/2023 0344   MCHC 34.9 06/29/2023 0344   RDW 13.2 06/29/2023 0344   RDW 14.4 12/27/2022 1508   LYMPHSABS 1.9 12/27/2022 1508   EOSABS 0.1 12/27/2022 1508   BASOSABS 0.0 12/27/2022 1508      Latest Ref Rng & Units 06/30/2023    2:43 AM 06/29/2023   11:11 AM 06/29/2023    3:44 AM  CMP  Glucose 70 - 99 mg/dL 86   952   BUN 6 - 20 mg/dL 17   18   Creatinine 8.41 - 1.24 mg/dL 3.24   4.01   Sodium 027 - 145 mmol/L 137   135   Potassium 3.5 - 5.1 mmol/L 3.4  3.7  2.8   Chloride 98 - 111 mmol/L 103   100   CO2 22 - 32 mmol/L 23   25   Calcium 8.9 - 10.3 mg/dL 9.0   9.1     Physical Exam Constitutional: Patient is resting comfortably in bed in no acute distress.  CV: RRR, no r/m/g, no LE edema.   Pulmonary/Respiratory: Normal respiratory effort on room air.  Skin: Warm and dry.  Psych: Normal mood and affect.   Assessment/Plan:  Active Problems:   Tobacco use   Essential hypertension   Chest pain   Left ventricular hypertrophy   Hyperlipidemia with target LDL less than 70  Drean Remme is a 47 yo person living with a history of hypertension and hyperlipidemia who presented with substernal chest pain and admitted for chest pain.   Cardiac chest pain TTE with moderate concentric LVH but otherwise no abnormalities and normal EF. Coronary CT with FFR on 11/22 had findings concerning in the LAD. Cardiology recommending left heart cath, possibly 11/25. Low suspicion for pericarditis, remains on colchicine, clarify total course with cardiology prior to discharge.   Plan: -NPO at midnight for [facility] 11/25 -Continue colchicine 0.6 mg BID, and pantoprazole 20 mg daily  -Continue aspirin 81 mg daily -Avoid nitroglycerin (lowered BP significantly)  Hypokalemia K 3.4 on 11/23, repleted with PO potassium. Will monitor on labs and replete as necessary.  Hypertension Blood pressure remains stable off home medications.  Home medications include amlodipine 10 mg, chlorthalidone 25 mg, and valsartan 80 mg.   Hyperlipidemia Chronic. Lipid panel on admission with total cholesterol of 218 and LDL 112. ASCVD risk 10.1%. -Continue atorvastatin 80 mg  Tobacco use Patient with 30 year smoking history, smokes about 3 cigarettes a day. Will counsel on smoking cessation while inpatient and medication that can aid in cessation (ie NRT or chantix) with outpatient follow up.   Prior to Admission Living Arrangement: Home Anticipated Discharge Location: Home Barriers to Discharge: Medical management Dispo: Anticipated discharge in approximately 2-3 day(s).   Philomena Doheny, MD, PGY-1 07/01/2023, 6:31 AM Pager: [phone] After 5pm on weekdays and 1pm on weekends: On Call pager  [phone]

## 2023-07-01 NOTE — H&P (View-Only) (Signed)
Rounding Note    Patient Name: Paul Molina Date of Encounter: 07/01/2023  [facility] Cardiologist:  New   Subjective   Denies CP today  Breathing is OK   Inpatient Medications    Scheduled Meds:  aspirin  81 mg Oral Daily   atorvastatin  80 mg Oral Daily   colchicine  0.6 mg Oral BID   heparin  5,000 Units Subcutaneous Q8H   melatonin  3 mg Oral QHS   pantoprazole  20 mg Oral Daily   Continuous Infusions:  PRN Meds: acetaminophen   Vital Signs    Vitals:   07/01/23 0122 07/01/23 0426 07/01/23 0854 07/01/23 1137  BP: 128/80 126/82 115/86 (!) 134/93  Pulse: 69 72 92 75  Resp:   16 16  Temp: 97.8 F (36.6 C) 97.9 F (36.6 C) 98 F (36.7 C) 98.3 F (36.8 C)  TempSrc: Oral Oral Oral Oral  SpO2: 100% 100% 97% 99%  Weight:  81.3 kg    Height:        Intake/Output Summary (Last 24 hours) at 07/01/2023 1228 Last data filed at 07/01/2023 0900 Gross per 24 hour  Intake 240 ml  Output --  Net 240 ml      07/01/2023    4:26 AM 06/30/2023    4:20 AM 06/29/2023    4:00 AM  Last 3 Weights  Weight (lbs) 179 lb 3.7 oz 177 lb 0.5 oz 174 lb 8 oz  Weight (kg) 81.3 kg 80.3 kg 79.153 kg      Telemetry    SR  - Personally Reviewed  ECG     SR, RAD - Personally Reviewed  Physical Exam   GEN: No acute distress.   Neck: No JVD Cardiac: RRR, no murmur   No rub Respiratory: Clear to auscultation  GI: Soft, nontender, non-distended  Ex    No edema  2+ PT pulses   Labs    High Sensitivity Troponin:   Recent Labs  Lab 06/28/23 0620 06/28/23 1134 06/28/23 1954  TROPONINIHS 6 7 6      Chemistry Recent Labs  Lab 06/28/23 0620 06/29/23 0344 06/29/23 1111 06/30/23 0243  NA 133* 135  --  137  K 3.7 2.8* 3.7 3.4*  CL 96* 100  --  103  CO2 27 25  --  23  GLUCOSE 127* 124*  --  86  BUN 10 18  --  17  CREATININE 1.11 1.28*  --  1.06  CALCIUM 9.8 9.1  --  9.0  MG  --  2.0  --   --   GFRNONAA >60 >60  --  >60  ANIONGAP 10 10  --  11     Lipids  Recent Labs  Lab 06/28/23 0620  CHOL 218*  TRIG 105  HDL 85  LDLCALC 112*  CHOLHDL 2.6    Hematology Recent Labs  Lab 06/28/23 0620 06/29/23 0344  WBC 9.1 5.4  RBC 4.79 4.72  HGB 15.7 15.4  HCT 44.9 44.1  MCV 93.7 93.4  MCH 32.8 32.6  MCHC 35.0 34.9  RDW 13.5 13.2  PLT 223 199   Thyroid No results for input(s): "TSH", "FREET4" in the last 168 hours.  BNPNo results for input(s): "BNP", "PROBNP" in the last 168 hours.  DDimer  Recent Labs  Lab 06/28/23 0748  DDIMER <0.27     Radiology    CT CORONARY FFR DATA PREP & FLUID ANALYSIS  Result Date: 06/29/2023 EXAM: CT FFR ANALYSIS CLINICAL DATA:  [id number] Chest pain [phone] FINDINGS: FFRct analysis was performed on the original cardiac CT angiogram dataset. Diagrammatic representation of the FFRct analysis is provided in a separate PDF document in PACS. This dictation was created using the PDF document and an interactive 3D model of the results. 3D model is not available in the EMR/PACS. Normal FFR range is >0.80. Indeterminate (grey) zone is 0.76-0.80. 1. Left Main: FFR = 0.99 2. LAD: Proximal FFR = 0.96, mid FFR = 0.77, distal FFR = 0.73 3. D1: Proximal FFR = not modeled 4. D2: Proximal FFR = 0.93, distal FFR = 0.84 5. LCX: Proximal FFR = 0.98, distal FFR = 0.90 6. Ramus: Not modeled. 7. RCA: Proximal FFR = 0.99, mid FFR =not modeled, distal FFR = not modeled IMPRESSION: 1. CT FFR analysis showed no significant stenosis within the left main, LCX, proximal RCA. 2. CT FFR suggestive of hemodynamically significant stenosis in proximal to mid LAD due to the translation difference between the segments (FFR valves are in the indeterminate zone). RECOMMENDATIONS: Goal directed medical therapy (up titration of anti-anginal and statin therapies) and aggressive risk factor modification for secondary prevention of coronary artery disease is strongly recommended. If refractory to medical therapy and/or based on clinical judgment invasive  angiography for further evaluation could be considered. Findings discussed with attending cardiologist Dr. Tenny Craw. Electronically Signed   By: Tessa Lerner D.O.   On: 06/29/2023 15:56   CT CORONARY MORPH W/CTA COR W/SCORE W/CA W/CM &/OR WO/CM  Result Date: 06/29/2023 HISTORY: Chest pain, nonspecific EXAM: Cardiac/Coronary  CT TECHNIQUE: The patient was scanned on a [employer]. PROTOCOL: A non-contrast, gated CT scan was obtained with axial slices of 3 mm through the heart for calcium scoring. Calcium scoring was performed using the Agatston method. A 120 kV prospective, gated, contrast cardiac scan was obtained. Gantry rotation speed was 250 msecs and collimation was 0.6 mm. Two sublingual nitroglycerin tablets (0.8 mg) were given. The 3D data set was reconstructed in 5% intervals of the 35-75% of the R-R cycle. Diastolic phases were analyzed on a dedicated workstation using MPR, MIP, and VRT modes. The patient received 95 cc of contrast. FINDINGS: Image quality: Average. Artifact: Limited. Coronary artery calcification score: Left main: 29.5 Left anterior descending artery: 282 Left circumflex artery: 0 Right coronary artery: 0.7 Total coronary calcium score of 312, places the patient at the 99th percentile for age and sex matched control. Coronary arteries: Normal coronary origins.  Left dominance. Left Main Coronary Artery: Normal caliber vessel, originates from the left coronary cusp, trifurcates to form a left anterior descending artery (LAD) left circumflex artery (LCX), and ramus intermedius. Proximal to mid segment patent. Minimal stenosis (0-24%) due to mixed plaque at the distal segment. Left Anterior Descending Artery: Normal caliber vessel, wraps the apex, gives off 2 diagonal branches. Moderate stenosis (50-69%) due to mixed plaque at the proximal LAD (at the first diagonal take off). Moderate stenosis (50-69%) due to mixed plaque at the mid LAD. Distal to apical segments patent. First  Diagonal branch: Normal caliber vessel, bifurcates into superior and inferior branches. Moderate stenosis (50-69%) due to calcified plaque at the ostial to proximal segment. Second Diagonal branch: Normal caliber vessel. Moderate stenosis (50-69%) closer to 50% due to calcified plaque at the proximal segment and remainder of the vessel is patent. Left Circumflex Artery: Normal caliber vessel, dominant, travels within the atrioventricular groove, gives off 3 obtuse marginal branches, terminates as a L-PDA and posterolateral branch. The LCX is patent with no  evidence of plaque or stenosis. First Obtuse Marginal branch: Normal caliber, large vessel, bifurcates into superior and inferior branches, supplies the lateral wall, patent. Second Obtuse Marginal branch: Small, patent. Third Obtuse Marginal branch: Normal size, patent. Ramus intermedius: Small size, patent. Right Coronary Artery: Non-dominant vessel, originates from the right coronary cusp, with minimal luminal irregularities. Left Atrium: Grossly normal in size with no left atrial appendage filling defect. Left Ventricle: Grossly normal in size. There are no stigmata of prior infarction. There is no abnormal filling defect. Pulmonary arteries: Normal in size without proximal filling defect. Pulmonary veins: Normal pulmonary venous drainage. Aorta: Normal size, 33.4 mm at the mid ascending aorta (level of the PA bifurcation) measured double oblique. No calcifications. No dissection. Pericardium: Normal thickness with no significant effusion or calcium present. Aortic Valve:Trileaflet without calcification. Mitral Valve: Normal structure without calcification. Extra-cardiac findings: See attached radiology report for non-cardiac structures. IMPRESSION: 1. Coronary calcium score of 312 . This was 48th percentile for age-, sex, and race-matched controls. 2. Total plaque volume (TPV) 376 mm3 which is 84th percentile for age- and sex-matched controls (calcified  plaque 57 mm3; non-calcified plaque 319 mm3 (low attenuation plaque 15 mm3)). 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 3.  Normal coronary origin with Left dominance. 4.  CAD-RADS = 3. Left Main: Minimal stenosis (0-24%) due to mixed plaque at the distal segment. LAD: Moderate disease noted within the proximal & mid segments. Distal to apical segments patent. See above for details. First and Second Diagonal branches: Moderate disease. See above for details. LCX: Dominant, patent. RCA: Patent. 5. Study is sent for CT-FFR to further evaluate the LAD and Diagonal distribution. Findings will be performed and reported separately. RECOMMENDATIONS: Consider symptom-guided anti-ischemic pharmacotherapy as well as risk factor modification per guideline directed care. Additional analysis with CT FFR will be submitted. Electronically Signed   By: Tessa Lerner D.O.   On: 06/29/2023 15:43    Cardiac Studies   Echo ordered  CT ordered   Patient Profile     47 y.o. male with hx of  HTN, anxiety, tobacco use   Admitted for CP   Assessment & Plan    CP    Atypical for coronary ischemia     Review of record he CP and pt started on colchicine and NSAIDS  Also on ASA EKGs show some dynamic changes, obscured though by LVH Coronary Ca++ score at 99%ile;  CT FFR suggestive of significant prox-mid LAD lesion Plan for coronary angiogram Monday. I discussed with the patient. Make NPO after midnight tonight  I have reviewed EKGs and compared to previous EKGs    They are dynamic    Has evid of LVH which makes specificity of conclusions limited  He has had some ST elevation on previous EKG   More consistent with early repolarization There was no PR depression   Last EKG with some PR depression in inf and lateral leads   ESR normal though does not rule out pericarditis   Trop neg x 2   Hx HTN     Good control  HL     LDL mildly increased     Evidence of CAD on chest CT   Statin started    Will follow as ouptpt  Tobacco abuse    Counselled on cessation.     For questions or updates, please contact [facility] Please consult [email or web address] for contact info under        Signed, Maurice Small, MD  07/01/2023, 12:28 PM

## 2023-07-01 NOTE — Progress Notes (Signed)
Rounding Note    Patient Name: Paul Molina Date of Encounter: 07/01/2023  [facility] Cardiologist:  New   Subjective   Denies CP today  Breathing is OK   Inpatient Medications    Scheduled Meds:  aspirin  81 mg Oral Daily   atorvastatin  80 mg Oral Daily   colchicine  0.6 mg Oral BID   heparin  5,000 Units Subcutaneous Q8H   melatonin  3 mg Oral QHS   pantoprazole  20 mg Oral Daily   Continuous Infusions:  PRN Meds: acetaminophen   Vital Signs    Vitals:   07/01/23 0122 07/01/23 0426 07/01/23 0854 07/01/23 1137  BP: 128/80 126/82 115/86 (!) 134/93  Pulse: 69 72 92 75  Resp:   16 16  Temp: 97.8 F (36.6 C) 97.9 F (36.6 C) 98 F (36.7 C) 98.3 F (36.8 C)  TempSrc: Oral Oral Oral Oral  SpO2: 100% 100% 97% 99%  Weight:  81.3 kg    Height:        Intake/Output Summary (Last 24 hours) at 07/01/2023 1228 Last data filed at 07/01/2023 0900 Gross per 24 hour  Intake 240 ml  Output --  Net 240 ml      07/01/2023    4:26 AM 06/30/2023    4:20 AM 06/29/2023    4:00 AM  Last 3 Weights  Weight (lbs) 179 lb 3.7 oz 177 lb 0.5 oz 174 lb 8 oz  Weight (kg) 81.3 kg 80.3 kg 79.153 kg      Telemetry    SR  - Personally Reviewed  ECG     SR, RAD - Personally Reviewed  Physical Exam   GEN: No acute distress.   Neck: No JVD Cardiac: RRR, no murmur   No rub Respiratory: Clear to auscultation  GI: Soft, nontender, non-distended  Ex    No edema  2+ PT pulses   Labs    High Sensitivity Troponin:   Recent Labs  Lab 06/28/23 0620 06/28/23 1134 06/28/23 1954  TROPONINIHS 6 7 6      Chemistry Recent Labs  Lab 06/28/23 0620 06/29/23 0344 06/29/23 1111 06/30/23 0243  NA 133* 135  --  137  K 3.7 2.8* 3.7 3.4*  CL 96* 100  --  103  CO2 27 25  --  23  GLUCOSE 127* 124*  --  86  BUN 10 18  --  17  CREATININE 1.11 1.28*  --  1.06  CALCIUM 9.8 9.1  --  9.0  MG  --  2.0  --   --   GFRNONAA >60 >60  --  >60  ANIONGAP 10 10  --  11     Lipids  Recent Labs  Lab 06/28/23 0620  CHOL 218*  TRIG 105  HDL 85  LDLCALC 112*  CHOLHDL 2.6    Hematology Recent Labs  Lab 06/28/23 0620 06/29/23 0344  WBC 9.1 5.4  RBC 4.79 4.72  HGB 15.7 15.4  HCT 44.9 44.1  MCV 93.7 93.4  MCH 32.8 32.6  MCHC 35.0 34.9  RDW 13.5 13.2  PLT 223 199   Thyroid No results for input(s): "TSH", "FREET4" in the last 168 hours.  BNPNo results for input(s): "BNP", "PROBNP" in the last 168 hours.  DDimer  Recent Labs  Lab 06/28/23 0748  DDIMER <0.27     Radiology    CT CORONARY FFR DATA PREP & FLUID ANALYSIS  Result Date: 06/29/2023 EXAM: CT FFR ANALYSIS CLINICAL DATA:  [id number] Chest pain [phone] FINDINGS: FFRct analysis was performed on the original cardiac CT angiogram dataset. Diagrammatic representation of the FFRct analysis is provided in a separate PDF document in PACS. This dictation was created using the PDF document and an interactive 3D model of the results. 3D model is not available in the EMR/PACS. Normal FFR range is >0.80. Indeterminate (grey) zone is 0.76-0.80. 1. Left Main: FFR = 0.99 2. LAD: Proximal FFR = 0.96, mid FFR = 0.77, distal FFR = 0.73 3. D1: Proximal FFR = not modeled 4. D2: Proximal FFR = 0.93, distal FFR = 0.84 5. LCX: Proximal FFR = 0.98, distal FFR = 0.90 6. Ramus: Not modeled. 7. RCA: Proximal FFR = 0.99, mid FFR =not modeled, distal FFR = not modeled IMPRESSION: 1. CT FFR analysis showed no significant stenosis within the left main, LCX, proximal RCA. 2. CT FFR suggestive of hemodynamically significant stenosis in proximal to mid LAD due to the translation difference between the segments (FFR valves are in the indeterminate zone). RECOMMENDATIONS: Goal directed medical therapy (up titration of anti-anginal and statin therapies) and aggressive risk factor modification for secondary prevention of coronary artery disease is strongly recommended. If refractory to medical therapy and/or based on clinical judgment invasive  angiography for further evaluation could be considered. Findings discussed with attending cardiologist Dr. Tenny Craw. Electronically Signed   By: Tessa Lerner D.O.   On: 06/29/2023 15:56   CT CORONARY MORPH W/CTA COR W/SCORE W/CA W/CM &/OR WO/CM  Result Date: 06/29/2023 HISTORY: Chest pain, nonspecific EXAM: Cardiac/Coronary  CT TECHNIQUE: The patient was scanned on a [employer]. PROTOCOL: A non-contrast, gated CT scan was obtained with axial slices of 3 mm through the heart for calcium scoring. Calcium scoring was performed using the Agatston method. A 120 kV prospective, gated, contrast cardiac scan was obtained. Gantry rotation speed was 250 msecs and collimation was 0.6 mm. Two sublingual nitroglycerin tablets (0.8 mg) were given. The 3D data set was reconstructed in 5% intervals of the 35-75% of the R-R cycle. Diastolic phases were analyzed on a dedicated workstation using MPR, MIP, and VRT modes. The patient received 95 cc of contrast. FINDINGS: Image quality: Average. Artifact: Limited. Coronary artery calcification score: Left main: 29.5 Left anterior descending artery: 282 Left circumflex artery: 0 Right coronary artery: 0.7 Total coronary calcium score of 312, places the patient at the 99th percentile for age and sex matched control. Coronary arteries: Normal coronary origins.  Left dominance. Left Main Coronary Artery: Normal caliber vessel, originates from the left coronary cusp, trifurcates to form a left anterior descending artery (LAD) left circumflex artery (LCX), and ramus intermedius. Proximal to mid segment patent. Minimal stenosis (0-24%) due to mixed plaque at the distal segment. Left Anterior Descending Artery: Normal caliber vessel, wraps the apex, gives off 2 diagonal branches. Moderate stenosis (50-69%) due to mixed plaque at the proximal LAD (at the first diagonal take off). Moderate stenosis (50-69%) due to mixed plaque at the mid LAD. Distal to apical segments patent. First  Diagonal branch: Normal caliber vessel, bifurcates into superior and inferior branches. Moderate stenosis (50-69%) due to calcified plaque at the ostial to proximal segment. Second Diagonal branch: Normal caliber vessel. Moderate stenosis (50-69%) closer to 50% due to calcified plaque at the proximal segment and remainder of the vessel is patent. Left Circumflex Artery: Normal caliber vessel, dominant, travels within the atrioventricular groove, gives off 3 obtuse marginal branches, terminates as a L-PDA and posterolateral branch. The LCX is patent with no  evidence of plaque or stenosis. First Obtuse Marginal branch: Normal caliber, large vessel, bifurcates into superior and inferior branches, supplies the lateral wall, patent. Second Obtuse Marginal branch: Small, patent. Third Obtuse Marginal branch: Normal size, patent. Ramus intermedius: Small size, patent. Right Coronary Artery: Non-dominant vessel, originates from the right coronary cusp, with minimal luminal irregularities. Left Atrium: Grossly normal in size with no left atrial appendage filling defect. Left Ventricle: Grossly normal in size. There are no stigmata of prior infarction. There is no abnormal filling defect. Pulmonary arteries: Normal in size without proximal filling defect. Pulmonary veins: Normal pulmonary venous drainage. Aorta: Normal size, 33.4 mm at the mid ascending aorta (level of the PA bifurcation) measured double oblique. No calcifications. No dissection. Pericardium: Normal thickness with no significant effusion or calcium present. Aortic Valve:Trileaflet without calcification. Mitral Valve: Normal structure without calcification. Extra-cardiac findings: See attached radiology report for non-cardiac structures. IMPRESSION: 1. Coronary calcium score of 312 . This was 48th percentile for age-, sex, and race-matched controls. 2. Total plaque volume (TPV) 376 mm3 which is 84th percentile for age- and sex-matched controls (calcified  plaque 57 mm3; non-calcified plaque 319 mm3 (low attenuation plaque 15 mm3)). 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 3.  Normal coronary origin with Left dominance. 4.  CAD-RADS = 3. Left Main: Minimal stenosis (0-24%) due to mixed plaque at the distal segment. LAD: Moderate disease noted within the proximal & mid segments. Distal to apical segments patent. See above for details. First and Second Diagonal branches: Moderate disease. See above for details. LCX: Dominant, patent. RCA: Patent. 5. Study is sent for CT-FFR to further evaluate the LAD and Diagonal distribution. Findings will be performed and reported separately. RECOMMENDATIONS: Consider symptom-guided anti-ischemic pharmacotherapy as well as risk factor modification per guideline directed care. Additional analysis with CT FFR will be submitted. Electronically Signed   By: Tessa Lerner D.O.   On: 06/29/2023 15:43    Cardiac Studies   Echo ordered  CT ordered   Patient Profile     47 y.o. male with hx of  HTN, anxiety, tobacco use   Admitted for CP   Assessment & Plan    CP    Atypical for coronary ischemia     Review of record he CP and pt started on colchicine and NSAIDS  Also on ASA EKGs show some dynamic changes, obscured though by LVH Coronary Ca++ score at 99%ile;  CT FFR suggestive of significant prox-mid LAD lesion Plan for coronary angiogram Monday. I discussed with the patient. Make NPO after midnight tonight  I have reviewed EKGs and compared to previous EKGs    They are dynamic    Has evid of LVH which makes specificity of conclusions limited  He has had some ST elevation on previous EKG   More consistent with early repolarization There was no PR depression   Last EKG with some PR depression in inf and lateral leads   ESR normal though does not rule out pericarditis   Trop neg x 2   Hx HTN     Good control  HL     LDL mildly increased     Evidence of CAD on chest CT   Statin started    Will follow as ouptpt  Tobacco abuse    Counselled on cessation.     For questions or updates, please contact [facility] Please consult [email or web address] for contact info under        Signed, Maurice Small, MD  07/01/2023, 12:28 PM

## 2023-07-02 ENCOUNTER — Telehealth: Payer: Self-pay | Admitting: Physician Assistant

## 2023-07-02 ENCOUNTER — Other Ambulatory Visit (HOSPITAL_COMMUNITY): Payer: Self-pay

## 2023-07-02 ENCOUNTER — Inpatient Hospital Stay (HOSPITAL_COMMUNITY)
Admission: EM | Disposition: A | Discharge status: Home / Self Care | Payer: Self-pay | Source: Home / Self Care | Attending: Infectious Diseases

## 2023-07-02 DIAGNOSIS — F17210 Nicotine dependence, cigarettes, uncomplicated: Secondary | ICD-10-CM | POA: Diagnosis not present

## 2023-07-02 DIAGNOSIS — I160 Hypertensive urgency: Secondary | ICD-10-CM | POA: Diagnosis not present

## 2023-07-02 DIAGNOSIS — E785 Hyperlipidemia, unspecified: Secondary | ICD-10-CM | POA: Diagnosis not present

## 2023-07-02 DIAGNOSIS — I25110 Atherosclerotic heart disease of native coronary artery with unstable angina pectoris: Secondary | ICD-10-CM | POA: Diagnosis not present

## 2023-07-02 DIAGNOSIS — R072 Precordial pain: Secondary | ICD-10-CM | POA: Diagnosis present

## 2023-07-02 DIAGNOSIS — Z79899 Other long term (current) drug therapy: Secondary | ICD-10-CM | POA: Diagnosis not present

## 2023-07-02 DIAGNOSIS — E7800 Pure hypercholesterolemia, unspecified: Secondary | ICD-10-CM | POA: Diagnosis not present

## 2023-07-02 DIAGNOSIS — I959 Hypotension, unspecified: Secondary | ICD-10-CM | POA: Diagnosis present

## 2023-07-02 DIAGNOSIS — Z5986 Financial insecurity: Secondary | ICD-10-CM | POA: Diagnosis not present

## 2023-07-02 DIAGNOSIS — I2583 Coronary atherosclerosis due to lipid rich plaque: Secondary | ICD-10-CM

## 2023-07-02 DIAGNOSIS — I2510 Atherosclerotic heart disease of native coronary artery without angina pectoris: Secondary | ICD-10-CM | POA: Diagnosis not present

## 2023-07-02 DIAGNOSIS — I119 Hypertensive heart disease without heart failure: Secondary | ICD-10-CM | POA: Diagnosis not present

## 2023-07-02 DIAGNOSIS — F419 Anxiety disorder, unspecified: Secondary | ICD-10-CM | POA: Diagnosis not present

## 2023-07-02 DIAGNOSIS — R011 Cardiac murmur, unspecified: Secondary | ICD-10-CM | POA: Diagnosis present

## 2023-07-02 DIAGNOSIS — Z7902 Long term (current) use of antithrombotics/antiplatelets: Secondary | ICD-10-CM | POA: Diagnosis not present

## 2023-07-02 DIAGNOSIS — R079 Chest pain, unspecified: Secondary | ICD-10-CM | POA: Diagnosis not present

## 2023-07-02 DIAGNOSIS — E876 Hypokalemia: Secondary | ICD-10-CM | POA: Diagnosis not present

## 2023-07-02 DIAGNOSIS — I319 Disease of pericardium, unspecified: Secondary | ICD-10-CM | POA: Diagnosis not present

## 2023-07-02 DIAGNOSIS — Z634 Disappearance and death of family member: Secondary | ICD-10-CM | POA: Diagnosis not present

## 2023-07-02 DIAGNOSIS — Z23 Encounter for immunization: Secondary | ICD-10-CM | POA: Diagnosis not present

## 2023-07-02 DIAGNOSIS — Z7982 Long term (current) use of aspirin: Secondary | ICD-10-CM | POA: Diagnosis not present

## 2023-07-02 DIAGNOSIS — Z8249 Family history of ischemic heart disease and other diseases of the circulatory system: Secondary | ICD-10-CM | POA: Diagnosis not present

## 2023-07-02 DIAGNOSIS — I10 Essential (primary) hypertension: Secondary | ICD-10-CM | POA: Diagnosis not present

## 2023-07-02 DIAGNOSIS — R7982 Elevated C-reactive protein (CRP): Secondary | ICD-10-CM | POA: Diagnosis present

## 2023-07-02 HISTORY — PX: CORONARY STENT INTERVENTION: CATH118234

## 2023-07-02 HISTORY — PX: LEFT HEART CATH AND CORONARY ANGIOGRAPHY: CATH118249

## 2023-07-02 LAB — BASIC METABOLIC PANEL
Anion gap: 10 (ref 5–15)
BUN: 16 mg/dL (ref 6–20)
CO2: 22 mmol/L (ref 22–32)
Calcium: 9.2 mg/dL (ref 8.9–10.3)
Chloride: 101 mmol/L (ref 98–111)
Creatinine, Ser: 1.14 mg/dL (ref 0.61–1.24)
GFR, Estimated: >60 mL/min (ref >60)
Glucose, Bld: 78 mg/dL (ref 70–99)
Potassium: 3.4 mmol/L — ABNORMAL LOW (ref 3.5–5.1)
Sodium: 133 mmol/L — ABNORMAL LOW (ref 135–145)

## 2023-07-02 LAB — POCT ACTIVATED CLOTTING TIME
Activated Clotting Time: 285 s
Activated Clotting Time: 297 s

## 2023-07-02 SURGERY — LEFT HEART CATH AND CORONARY ANGIOGRAPHY
Anesthesia: LOCAL

## 2023-07-02 MED ORDER — CLOPIDOGREL BISULFATE 75 MG PO TABS
75.0000 mg | ORAL_TABLET | Freq: Every day | ORAL | 1 refills | Status: DC
  Filled 2023-07-02: qty 30, 30d supply, fill #0
  Filled 2023-07-31 (×2): qty 30, 30d supply, fill #1

## 2023-07-02 MED ORDER — ASPIRIN 81 MG PO CHEW
81.0000 mg | CHEWABLE_TABLET | Freq: Every day | ORAL | 1 refills | Status: DC
  Filled 2023-07-02: qty 90, 90d supply, fill #0
  Filled 2023-10-02: qty 90, 90d supply, fill #1

## 2023-07-02 MED ORDER — CLOPIDOGREL BISULFATE 75 MG PO TABS: 75.0000 mg | ORAL_TABLET | Freq: Every day | ORAL | Status: DC

## 2023-07-02 MED ORDER — VERAPAMIL HCL 2.5 MG/ML IV SOLN
INTRAVENOUS | Status: DC | PRN
  Administered 2023-07-02: 10 mL via INTRA_ARTERIAL

## 2023-07-02 MED ORDER — LABETALOL HCL 5 MG/ML IV SOLN: 10.0000 mg | INTRAVENOUS | Status: AC | PRN

## 2023-07-02 MED ORDER — FENTANYL CITRATE (PF) 100 MCG/2ML IJ SOLN
INTRAMUSCULAR | Status: AC
  Filled 2023-07-02: qty 2

## 2023-07-02 MED ORDER — NITROGLYCERIN 1 MG/10 ML FOR IR/CATH LAB
INTRA_ARTERIAL | Status: AC
  Filled 2023-07-02: qty 10

## 2023-07-02 MED ORDER — FENTANYL CITRATE (PF) 100 MCG/2ML IJ SOLN
INTRAMUSCULAR | Status: DC | PRN
  Administered 2023-07-02: 25 ug via INTRAVENOUS
  Administered 2023-07-02: 50 ug via INTRAVENOUS

## 2023-07-02 MED ORDER — FAMOTIDINE IN NACL 20-0.9 MG/50ML-% IV SOLN
INTRAVENOUS | Status: AC | PRN
  Administered 2023-07-02: 20 mg via INTRAVENOUS

## 2023-07-02 MED ORDER — MIDAZOLAM HCL 2 MG/2ML IJ SOLN
INTRAMUSCULAR | Status: DC | PRN
  Administered 2023-07-02: 2 mg via INTRAVENOUS
  Administered 2023-07-02: 1 mg via INTRAVENOUS

## 2023-07-02 MED ORDER — CLOPIDOGREL BISULFATE 300 MG PO TABS
ORAL_TABLET | ORAL | Status: DC | PRN
  Administered 2023-07-02: 600 mg via ORAL

## 2023-07-02 MED ORDER — AMLODIPINE BESYLATE 10 MG PO TABS
10.0000 mg | ORAL_TABLET | Freq: Every day | ORAL | Status: DC
  Administered 2023-07-02: 10 mg via ORAL
  Filled 2023-07-02: qty 1

## 2023-07-02 MED ORDER — HYDRALAZINE HCL 20 MG/ML IJ SOLN: 10.0000 mg | INTRAMUSCULAR | Status: AC | PRN

## 2023-07-02 MED ORDER — SODIUM CHLORIDE 0.9 % IV SOLN: 250.0000 mL | INTRAVENOUS | Status: DC | PRN

## 2023-07-02 MED ORDER — SODIUM CHLORIDE 0.9 % IV SOLN: INTRAVENOUS | Status: AC

## 2023-07-02 MED ORDER — POTASSIUM CHLORIDE CRYS ER 20 MEQ PO TBCR: 40.0000 meq | EXTENDED_RELEASE_TABLET | Freq: Two times a day (BID) | ORAL | Status: DC

## 2023-07-02 MED ORDER — SODIUM CHLORIDE 0.9% FLUSH: 3.0000 mL | INTRAVENOUS | Status: DC | PRN

## 2023-07-02 MED ORDER — ATORVASTATIN CALCIUM 80 MG PO TABS
80.0000 mg | ORAL_TABLET | Freq: Every day | ORAL | 1 refills | Status: DC
  Filled 2023-07-02: qty 30, 30d supply, fill #0
  Filled 2023-07-31 (×2): qty 30, 30d supply, fill #1

## 2023-07-02 MED ORDER — VERAPAMIL HCL 2.5 MG/ML IV SOLN
INTRAVENOUS | Status: AC
Start: 2023-07-02 — End: ?
  Filled 2023-07-02: qty 2

## 2023-07-02 MED ORDER — CLOPIDOGREL BISULFATE 300 MG PO TABS
ORAL_TABLET | ORAL | Status: AC
  Filled 2023-07-02: qty 2

## 2023-07-02 MED ORDER — MIDAZOLAM HCL 2 MG/2ML IJ SOLN
INTRAMUSCULAR | Status: AC
  Filled 2023-07-02: qty 2

## 2023-07-02 MED ORDER — HEPARIN (PORCINE) IN NACL 1000-0.9 UT/500ML-% IV SOLN
INTRAVENOUS | Status: DC | PRN
  Administered 2023-07-02 (×2): 500 mL

## 2023-07-02 MED ORDER — LIDOCAINE HCL (PF) 1 % IJ SOLN
INTRAMUSCULAR | Status: AC
  Filled 2023-07-02: qty 30

## 2023-07-02 MED ORDER — POTASSIUM CHLORIDE CRYS ER 20 MEQ PO TBCR
40.0000 meq | EXTENDED_RELEASE_TABLET | Freq: Once | ORAL | Status: AC
  Administered 2023-07-02: 40 meq via ORAL
  Filled 2023-07-02: qty 2

## 2023-07-02 MED ORDER — SODIUM CHLORIDE 0.9% FLUSH: 3.0000 mL | Freq: Two times a day (BID) | INTRAVENOUS | Status: DC

## 2023-07-02 MED ORDER — IOHEXOL 350 MG/ML SOLN
INTRAVENOUS | Status: DC | PRN
  Administered 2023-07-02: 80 mL

## 2023-07-02 MED ORDER — LIDOCAINE HCL (PF) 1 % IJ SOLN
INTRAMUSCULAR | Status: DC | PRN
  Administered 2023-07-02: 2 mL

## 2023-07-02 MED ORDER — HEPARIN SODIUM (PORCINE) 1000 UNIT/ML IJ SOLN
INTRAMUSCULAR | Status: DC | PRN
  Administered 2023-07-02: 6000 [IU] via INTRAVENOUS
  Administered 2023-07-02: 4000 [IU] via INTRAVENOUS

## 2023-07-02 MED ORDER — HEPARIN SODIUM (PORCINE) 1000 UNIT/ML IJ SOLN
INTRAMUSCULAR | Status: AC
  Filled 2023-07-02: qty 10

## 2023-07-02 SURGICAL SUPPLY — 16 items
BALLN EMERGE MR 2.0X12 (BALLOONS) ×1
BALLN ~~LOC~~ EMERGE MR 3.0X12 (BALLOONS) ×1
BALLOON EMERGE MR 2.0X12 (BALLOONS) IMPLANT
BALLOON ~~LOC~~ EMERGE MR 3.0X12 (BALLOONS) IMPLANT
CATH 5FR JL3.5 JR4 ANG PIG MP (CATHETERS) IMPLANT
CATH VISTA GUIDE 6FR XBLAD3.5 (CATHETERS) IMPLANT
DEVICE RAD COMP TR BAND LRG (VASCULAR PRODUCTS) IMPLANT
GLIDESHEATH SLEND SS 6F .021 (SHEATH) IMPLANT
GUIDEWIRE INQWIRE 1.5J.035X260 (WIRE) IMPLANT
INQWIRE 1.5J .035X260CM (WIRE) ×1
KIT ENCORE 26 ADVANTAGE (KITS) IMPLANT
PACK CARDIAC CATHETERIZATION (CUSTOM PROCEDURE TRAY) ×1 IMPLANT
SET ATX-X65L (MISCELLANEOUS) IMPLANT
STENT SYNERGY XD 2.75X16 (Permanent Stent) IMPLANT
SYNERGY XD 2.75X16 (Permanent Stent) ×1 IMPLANT
WIRE ASAHI PROWATER 180CM (WIRE) IMPLANT

## 2023-07-02 NOTE — Interval H&P Note (Signed)
History and Physical Interval Note:  07/02/2023 10:05 AM  Paul Molina  has presented today for surgery, with the diagnosis of unstable angina.  The various methods of treatment have been discussed with the patient and family. After consideration of risks, benefits and other options for treatment, the patient has consented to  Procedure(s): LEFT HEART CATH AND CORONARY ANGIOGRAPHY (N/A) as a surgical intervention.  The patient's history has been reviewed, patient examined, no change in status, stable for surgery.  I have reviewed the patient's chart and labs.  Questions were answered to the patient's satisfaction.    Cath Lab Visit (complete for each Cath Lab visit)  Clinical Evaluation Leading to the Procedure:   ACS: No.  Non-ACS:    Anginal Classification: CCS III  Anti-ischemic medical therapy: Minimal Therapy (1 class of medications)  Non-Invasive Test Results: High-risk stress test findings: cardiac mortality >3%/year (Coronary CTA with possible severe LAD stenosis)  Prior CABG: No previous CABG        Verne Carrow

## 2023-07-02 NOTE — Progress Notes (Signed)
Discussed with pt stent, restrictions, Plavix importance, diet, exercise, NTG, smoking cessation, and CRPII. Pt receptive with appropriate questions. He is smoking 2-3 cig a day and encouraged cold [place] after this hospital stay. Gave resources. Will refer to [place] CRPII.  [id number] Ethelda Chick BS, ACSM-CEP 07/02/2023 3:22 PM

## 2023-07-02 NOTE — Telephone Encounter (Signed)
Left voicemail to return call to office.

## 2023-07-02 NOTE — Discharge Instructions (Addendum)
You were seen for cardiac chest pain and found to have unstable angina due to significant blockage in one of your heart's main arteries. Dr. Clifton James from cardiology performed a left heart catheterization and placed a drug eluting stent in the proximal Left Anterior Descending artery. You were started on Dual Antiplatelet Therapy (DAPT) which includes daily Aspirin 81 mg and daily Plavix (Clopidogrel) 75 mg. It is VERY important that you take both of these medications consistently as stopping either could increase your risk of clotting in the stent. It was recommended that you take both of these medications for at least 6 months. You were also started on a medication for coronary artery disease called atorvastatin (taken 80 mg daily).   You will get a call to follow up on an ambulatory referral to cardiac rehabilitation. You have been scheduled for cardiology follow-up at Dr. Gibson Ramp office [facility]) on 07/09/2023 at 1:55 PM located at [address], [place] [address], [phone].  Your blood pressures during your admission have been 120s/80s-90s. We are resuming your amlodipine 10 mg daily. We do not want to resume all of your blood pressure medications at once in case they lower your blood pressure too much or too quickly.  Please STOP taking the following medications until you can discuss with your doctor at your follow-up visit:  Valsartan 80 mg daily  Chlorthalidone 25 mg daily   Please make a hospitalization follow-up appointment with your PCP, Bertram Denver, NP within the next 7-10 days. She will help follow up your blood pressure and help you restart your medications if indicated.   Please call 911 or go to the Emergency Department if you develop sudden chest pain or generalized weakness.  We are glad you are feeling better. It was a pleasure serving you during your stay. - Dr. Justin Mend and the Internal Medicine Teaching Service Team

## 2023-07-02 NOTE — Telephone Encounter (Signed)
   Transition of Care Follow-up Phone Call Request    Patient Name: Prish Trimper Date of Birth: Nov 13, 1975 Date of Encounter: 07/02/2023  Primary Care Provider:  Claiborne Rigg, NP Primary Cardiologist:  None  Leonides Schanz has been scheduled for a transition of care follow up appointment with a HeartCare provider:  [facility]  Please reach out to Leonides Schanz within 48 hours of discharge to confirm appointment and review transition of care protocol questionnaire. Anticipated discharge date: 12/25  Azalee Course, [place]  07/02/2023, 4:22 PM

## 2023-07-02 NOTE — Discharge Summary (Cosign Needed)
Name: Paul Molina MRN: [id number] DOB: [birth year] 47 y.o. PCP: Paul Rigg, NP  Date of Admission: 06/28/2023  6:12 AM Date of Discharge: 07/03/2023 12:08 PM Attending Physician: Dr. Mayford Molina  Discharge Diagnosis: Principal Problem:   Coronary artery disease involving native coronary artery of native heart with unstable angina pectoris (HCC) Active Problems:   Tobacco use   Essential hypertension   Left ventricular hypertrophy   Hyperlipidemia with target LDL less than 70   Status post insertion of drug-eluting stent into left anterior descending (LAD) artery   Hypokalemia    Discharge Medications: Allergies as of 07/02/2023   No Known Allergies      Medication List     STOP taking these medications    chlorthalidone 25 MG tablet Commonly known as: HYGROTON   valsartan 80 MG tablet Commonly known as: DIOVAN       TAKE these medications    amLODipine 10 MG tablet Commonly known as: NORVASC Take 1 tablet (10 mg total) by mouth daily.   Aspirin Low Dose 81 MG chewable tablet Generic drug: aspirin Chew 1 tablet (81 mg total) by mouth daily.   atorvastatin 80 MG tablet Commonly known as: LIPITOR Take 1 tablet (80 mg total) by mouth daily.   clopidogrel 75 MG tablet Commonly known as: PLAVIX Take 1 tablet (75 mg total) by mouth daily with breakfast.        Disposition and follow-up:   Paul Molina was discharged from [facility] in Stable condition.  At the hospital follow up visit please address:  1.  Follow-up:  Coronary Artery Disease, cardiac chest pain  - S/p DES stent placed in LAD. Started on DAPT, discharged with daily Aspirin 81 mg and Plavix 75 mg daily, please ensure patient is taking consistently along with atorvastatin 80 mg daily. Patient referred to cardiac rehab as well as cardiology outpatient, follow up that he has been contacted and these have been set up.     Hypertension - Patient's BP  110s-120s/80s-90s. His three antihypertensives prescribed prior to admission were not necessary and were withheld, resumed amlodipine 10 mg daily at discharge. Please check BP at follow up appointment and resume chlorthalidone 25 mg and valsartan 80 mg as needed.   Tobacco use  - Patient with active tobacco use. Please continue discussion on smoking cessation given increased risk for cardiac events.  Hypokalemia - Patient with low potassium (2.8, 3.4) during admission requiring repletion. Potassium of 3.4 on day of discharge treated with PO potassium chloride 40 mEq x 1 dose. Please check BMP at follow up and replete electrolytes as needed.   2.  Labs / imaging needed at time of follow-up: Blood pressure check, BMP (potassium)  3.  Pending labs/ test needing follow-up: n/a  4.  Medication Changes  STOPPED  - Valsartan 80 mg daily (held due to well controlled BP after LHC)  - Chlorthalidone 25 mg daily (held due to well controlled BP after LHC)   ADDED  - Aspirin 81 mg daily   - Plavix 75 mg daily    MODIFIED  - n/a  Follow-up Appointments:  Follow-up Information     Paul Montana, PA-C Follow up on 07/09/2023.   Specialties: Cardiology, Radiology Why: 1:55PM. Cardiology follow up Contact information: [address] [place] [address] [phone]               - Cardiology Rehabilitation (will call patient to set up)  - Hospitalization  follow-up appointment to be set up by patient with PCP Paul Denver, NP  Hospital Course by problem list: [name] is a 47 year old with HTN and hyperlipidemia who presented with substernal non-radiating chest pain. He was admitted to the Internal Medicine Teaching Service on 06/28/2023 for further work up and characterization of chest pain.   Coronary Artery Disease  Cardiac chest pain  Patient presented with substernal nonradiating chest pain since 11/20 afternoon around 3 PM.  Worse with positional change,  did not improve with rest. Given aspirin loading dose and nitroglycerin, which improved his chest pain but led to hypotension with BP dipping to 80s/40s. Risk factors for heart disease include hyperlipidemia, smoking history, and hypertension. No significant history of heart disease in family. CT chest with contrast showed calcified coronary artery plaque. EKG showed T wave inversions in leads 2, 3, and aVF.  Troponin peaked at 7. TIMI score of 2 points. Cardiology was consulted in the ED.   Patient's symptoms, including worsening of chest pain on deep inspiration, persistent nature of chest pain, and improvement in symptoms leaning forward initially concerning for pericarditis. However, no history of recent illness, EKG changes not entirely consistent with classic pericarditis, and no evidence of LE edema or friction rub on exam. Patient started on treatment for pericarditis including ibuprofen 600 mg TID, colchicine 0.6 mg BID, and pantoprazole 20 mg daily with improvement in chest pain. Treatment was discontinued when ischemic chest pain due to LAD lesion was concluded.  Coronary CT with FFR performed on 11/22 had findings concerning in the LAD, cardiology recommended left heart cath. Results lowered suspicion for pericarditis and ibuprofen was discontinued on 11/23. Cardiology recommended stopping treatment with colchicine following LHC as etiology of chest pain most likely due to unstable angina.   LHC on 11/25 showed 20% ostial to proximal LAD, 80% mid LAD followed by 40% mid LAD, 40% OM1 status post PCI stent of the proximal LAD. Circumflex was dominant. Loaded  on Plavix after the LHC, will continue DAPT with aspirin and Plavix for 6-12 months   F/u with cardiology scheduled for 07/09/23.  Referral to cardiac rehab is recommended.  Hypokalemia Patient's morning K 2.8 on 11/22, repleted with PO potassium chloride 40 mEq x 2 doses. K 3.4 11/23, repleted again with POT potassium chloride. On day of  discharge, K of 3.4 with repeat repletion of PO potassium chloride 40 mEq x 1 dose.    Hypertension Home medications include amlodipine 10 mg, chlorthalidone 25 mg, and valsartan 80 mg. BP throughout admission remained mostly in the 120s/80s and home antihypertensives were held. Following LHC, patient resumed on amlodipine 10 mg, held chlorthalidone 25 mg and valsartan 80 mg on discharge given controlled BP. Will need to repeat BP outpatient before resuming chlorthalidone and valsartan.    Hyperlipidemia Chronic. Lipid panel on admission with total cholesterol of 218 and LDL 112. ASCVD risk 10.1%. Continued atorvastatin 80 mg daily.  Goal LDL < 70, secondary prevention.  Tobacco use Patient with 30 year smoking history, smokes about 3 cigarettes a day. Discussed smoking cessation while inpatient and medication that can aid in cessation (ie NRT or chantix), outpatient follow up needed.    Discharge Subjective: Patient seen in the morning prior to [facility] and again after the [facility] s/p stent placement. Patient was alert, engaged in discussion, and appeared in good spirits. He endorsed feeling well, denied any CP or other acute concern. Reviewed next steps in care, patient agreeable with discharge today. Patient agreed  to call PCP to set up hospitalization follow up visit. Medications to be delivered to his room prior to discharge.    Discharge Exam:   Blood pressure (!) 127/98, pulse 70, temperature 97.9 F (36.6 C), temperature source Oral, resp. rate 17, height 6' (1.829 m), weight 80.6 kg, SpO2 97%.  Constitutional: well-appearing, in no acute distress  HENT: normocephalic atraumatic, mucous membranes moist Pulmonary/Chest: normal work of breathing on room air Neurological: alert & oriented, no focal deficits noted MSK: no gross abnormalities, moving all limbs spontaneously  Skin: warm and dry Psych: normal mood and affect  Pertinent Labs, Studies, and Procedures:     Latest Ref Rng & Units  06/29/2023    3:44 AM 06/28/2023    6:20 AM 12/27/2022    3:08 PM  CBC  WBC 4.0 - 10.5 K/uL 5.4  9.1  5.3   Hemoglobin 13.0 - 17.0 g/dL 86.5  78.4  69.6   Hematocrit 39.0 - 52.0 % 44.1  44.9  47.1   Platelets 150 - 400 K/uL 199  223  310        Latest Ref Rng & Units 07/02/2023    2:43 AM 06/30/2023    2:43 AM 06/29/2023   11:11 AM  CMP  Glucose 70 - 99 mg/dL 78  86    BUN 6 - 20 mg/dL 16  17    Creatinine 2.95 - 1.24 mg/dL 2.84  1.32    Sodium 440 - 145 mmol/L 133  137    Potassium 3.5 - 5.1 mmol/L 3.4  3.4  3.7   Chloride 98 - 111 mmol/L 101  103    CO2 22 - 32 mmol/L 22  23    Calcium 8.9 - 10.3 mg/dL 9.2  9.0      ECHOCARDIOGRAM COMPLETE  Result Date: 06/29/2023    ECHOCARDIOGRAM REPORT   Patient Name:   Leonides Schanz Date of Exam: 06/29/2023 Medical Rec #:  [id number]      Height:       72.0 in Accession #:    [id number]     Weight:       174.5 lb Date of Birth:  [birth year]      BSA:          2.011 m Patient Age:    47 years       BP:           118/86 mmHg Patient Gender: M              HR:           73 bpm. Exam Location:  Inpatient Procedure: 2D Echo, Cardiac Doppler and Color Doppler Indications:    CP  History:        Patient has prior history of Echocardiogram examinations, most                 recent 10/02/2019. Risk Factors:Hypertension.  Sonographer:    Vern Claude Referring Phys: Kelton Pillar [place] IMPRESSIONS  1. Left ventricular ejection fraction, by estimation, is 55 to 60%. The left ventricle has normal function. The left ventricle has no regional wall motion abnormalities. There is moderate concentric left ventricular hypertrophy. Left ventricular diastolic parameters were normal.  2. Right ventricular systolic function is normal. The right ventricular size is normal. There is normal pulmonary artery systolic pressure.  3. The mitral valve is normal in structure. Trivial mitral valve regurgitation. No evidence of mitral stenosis.  4. The aortic valve is tricuspid.  Aortic  valve regurgitation is not visualized. No aortic stenosis is present.  5. The inferior vena cava is normal in size with greater than 50% respiratory variability, suggesting right atrial pressure of 3 mmHg. Comparison(s): No significant change from prior study. Conclusion(s)/Recommendation(s): Otherwise normal echocardiogram, with minor abnormalities described in the report. FINDINGS  Left Ventricle: Left ventricular ejection fraction, by estimation, is 55 to 60%. The left ventricle has normal function. The left ventricle has no regional wall motion abnormalities. The left ventricular internal cavity size was normal in size. There is  moderate concentric left ventricular hypertrophy. Left ventricular diastolic parameters were normal. Right Ventricle: The right ventricular size is normal. Right vetricular wall thickness was not well visualized. Right ventricular systolic function is normal. There is normal pulmonary artery systolic pressure. The tricuspid regurgitant velocity is 2.24 m/s, and with an assumed right atrial pressure of 3 mmHg, the estimated right ventricular systolic pressure is 23.1 mmHg. Left Atrium: Left atrial size was normal in size. Right Atrium: Right atrial size was normal in size. Pericardium: There is no evidence of pericardial effusion. Mitral Valve: The mitral valve is normal in structure. Trivial mitral valve regurgitation. No evidence of mitral valve stenosis. MV peak gradient, 2.1 mmHg. The mean mitral valve gradient is 1.0 mmHg. Tricuspid Valve: The tricuspid valve is normal in structure. Tricuspid valve regurgitation is trivial. No evidence of tricuspid stenosis. Aortic Valve: The aortic valve is tricuspid. Aortic valve regurgitation is not visualized. No aortic stenosis is present. Aortic valve mean gradient measures 2.0 mmHg. Aortic valve peak gradient measures 3.0 mmHg. Aortic valve area, by VTI measures 2.67 cm. Pulmonic Valve: The pulmonic valve was not well visualized.  Pulmonic valve regurgitation is not visualized. No evidence of pulmonic stenosis. Aorta: The aortic root, ascending aorta and aortic arch are all structurally normal, with no evidence of dilitation or obstruction. Venous: The inferior vena cava is normal in size with greater than 50% respiratory variability, suggesting right atrial pressure of 3 mmHg. IAS/Shunts: The atrial septum is grossly normal.  LEFT VENTRICLE PLAX 2D LVIDd:         4.30 cm      Diastology LVIDs:         2.40 cm      LV e' medial:    5.35 cm/s LV PW:         1.40 cm      LV E/e' medial:  10.4 LV IVS:        1.60 cm      LV e' lateral:   7.22 cm/s LVOT diam:     2.00 cm      LV E/e' lateral: 7.7 LV SV:         44 LV SV Index:   22 LVOT Area:     3.14 cm  LV Volumes (MOD) LV vol d, MOD A2C: 112.0 ml LV vol d, MOD A4C: 107.0 ml LV vol s, MOD A2C: 52.5 ml LV vol s, MOD A4C: 42.6 ml LV SV MOD A2C:     59.5 ml LV SV MOD A4C:     107.0 ml LV SV MOD BP:      66.5 ml RIGHT VENTRICLE            IVC RV Basal diam:  3.30 cm    IVC diam: 1.50 cm RV Mid diam:    2.80 cm RV S prime:     7.70 cm/s TAPSE (M-mode): 1.8 cm LEFT ATRIUM  Index        RIGHT ATRIUM           Index LA Vol (A2C):   42.6 ml 21.19 ml/m  RA Area:     13.50 cm LA Vol (A4C):   29.7 ml 14.77 ml/m  RA Volume:   32.70 ml  16.26 ml/m LA Biplane Vol: 37.0 ml 18.40 ml/m  AORTIC VALVE                    PULMONIC VALVE AV Area (Vmax):    2.70 cm     PV Vmax:       0.65 m/s AV Area (Vmean):   2.40 cm     PV Peak grad:  1.7 mmHg AV Area (VTI):     2.67 cm AV Vmax:           86.20 cm/s AV Vmean:          63.300 cm/s AV VTI:            0.166 m AV Peak Grad:      3.0 mmHg AV Mean Grad:      2.0 mmHg LVOT Vmax:         74.20 cm/s LVOT Vmean:        48.300 cm/s LVOT VTI:          0.141 m LVOT/AV VTI ratio: 0.85  AORTA Ao Root diam: 3.10 cm Ao Asc diam:  3.20 cm MITRAL VALVE               TRICUSPID VALVE MV Area (PHT): 2.20 cm    TR Peak grad:   20.1 mmHg MV Area VTI:   2.15 cm    TR  Vmax:        224.00 cm/s MV Peak grad:  2.1 mmHg MV Mean grad:  1.0 mmHg    SHUNTS MV Vmax:       0.72 m/s    Systemic VTI:  0.14 m MV Vmean:      44.8 cm/s   Systemic Diam: 2.00 cm MV Decel Time: 345 msec MV E velocity: 55.80 cm/s MV A velocity: 78.80 cm/s MV E/A ratio:  0.71 Jodelle Red MD Electronically signed by Jodelle Red MD Signature Date/Time: 06/29/2023/4:51:05 PM    Final    CT CORONARY FFR DATA PREP & FLUID ANALYSIS  Result Date: 06/29/2023 EXAM: CT FFR ANALYSIS CLINICAL DATA:  [id number] Chest pain [id number] FINDINGS: FFRct analysis was performed on the original cardiac CT angiogram dataset. Diagrammatic representation of the FFRct analysis is provided in a separate PDF document in PACS. This dictation was created using the PDF document and an interactive 3D model of the results. 3D model is not available in the EMR/PACS. Normal FFR range is >0.80. Indeterminate (grey) zone is 0.76-0.80. 1. Left Main: FFR = 0.99 2. LAD: Proximal FFR = 0.96, mid FFR = 0.77, distal FFR = 0.73 3. D1: Proximal FFR = not modeled 4. D2: Proximal FFR = 0.93, distal FFR = 0.84 5. LCX: Proximal FFR = 0.98, distal FFR = 0.90 6. Ramus: Not modeled. 7. RCA: Proximal FFR = 0.99, mid FFR =not modeled, distal FFR = not modeled IMPRESSION: 1. CT FFR analysis showed no significant stenosis within the left main, LCX, proximal RCA. 2. CT FFR suggestive of hemodynamically significant stenosis in proximal to mid LAD due to the translation difference between the segments (FFR valves are in the indeterminate zone). RECOMMENDATIONS: Goal directed medical therapy (up titration of anti-anginal and  statin therapies) and aggressive risk factor modification for secondary prevention of coronary artery disease is strongly recommended. If refractory to medical therapy and/or based on clinical judgment invasive angiography for further evaluation could be considered. Findings discussed with attending cardiologist Dr. Tenny Craw.  Electronically Signed   By: Tessa Lerner D.O.   On: 06/29/2023 15:56   CT CORONARY MORPH W/CTA COR W/SCORE W/CA W/CM &/OR WO/CM  Result Date: 06/29/2023 HISTORY: Chest pain, nonspecific EXAM: Cardiac/Coronary  CT TECHNIQUE: The patient was scanned on a [employer]. PROTOCOL: A non-contrast, gated CT scan was obtained with axial slices of 3 mm through the heart for calcium scoring. Calcium scoring was performed using the Agatston method. A 120 kV prospective, gated, contrast cardiac scan was obtained. Gantry rotation speed was 250 msecs and collimation was 0.6 mm. Two sublingual nitroglycerin tablets (0.8 mg) were given. The 3D data set was reconstructed in 5% intervals of the 35-75% of the R-R cycle. Diastolic phases were analyzed on a dedicated workstation using MPR, MIP, and VRT modes. The patient received 95 cc of contrast. FINDINGS: Image quality: Average. Artifact: Limited. Coronary artery calcification score: Left main: 29.5 Left anterior descending artery: 282 Left circumflex artery: 0 Right coronary artery: 0.7 Total coronary calcium score of 312, places the patient at the 99th percentile for age and sex matched control. Coronary arteries: Normal coronary origins.  Left dominance. Left Main Coronary Artery: Normal caliber vessel, originates from the left coronary cusp, trifurcates to form a left anterior descending artery (LAD) left circumflex artery (LCX), and ramus intermedius. Proximal to mid segment patent. Minimal stenosis (0-24%) due to mixed plaque at the distal segment. Left Anterior Descending Artery: Normal caliber vessel, wraps the apex, gives off 2 diagonal branches. Moderate stenosis (50-69%) due to mixed plaque at the proximal LAD (at the first diagonal take off). Moderate stenosis (50-69%) due to mixed plaque at the mid LAD. Distal to apical segments patent. First Diagonal branch: Normal caliber vessel, bifurcates into superior and inferior branches. Moderate stenosis (50-69%)  due to calcified plaque at the ostial to proximal segment. Second Diagonal branch: Normal caliber vessel. Moderate stenosis (50-69%) closer to 50% due to calcified plaque at the proximal segment and remainder of the vessel is patent. Left Circumflex Artery: Normal caliber vessel, dominant, travels within the atrioventricular groove, gives off 3 obtuse marginal branches, terminates as a L-PDA and posterolateral branch. The LCX is patent with no evidence of plaque or stenosis. First Obtuse Marginal branch: Normal caliber, large vessel, bifurcates into superior and inferior branches, supplies the lateral wall, patent. Second Obtuse Marginal branch: Small, patent. Third Obtuse Marginal branch: Normal size, patent. Ramus intermedius: Small size, patent. Right Coronary Artery: Non-dominant vessel, originates from the right coronary cusp, with minimal luminal irregularities. Left Atrium: Grossly normal in size with no left atrial appendage filling defect. Left Ventricle: Grossly normal in size. There are no stigmata of prior infarction. There is no abnormal filling defect. Pulmonary arteries: Normal in size without proximal filling defect. Pulmonary veins: Normal pulmonary venous drainage. Aorta: Normal size, 33.4 mm at the mid ascending aorta (level of the PA bifurcation) measured double oblique. No calcifications. No dissection. Pericardium: Normal thickness with no significant effusion or calcium present. Aortic Valve:Trileaflet without calcification. Mitral Valve: Normal structure without calcification. Extra-cardiac findings: See attached radiology report for non-cardiac structures. IMPRESSION: 1. Coronary calcium score of 312 . This was 48th percentile for age-, sex, and race-matched controls. 2. Total plaque volume (TPV) 376 mm3 which is 84th percentile for  age- and sex-matched controls (calcified plaque 57 mm3; non-calcified plaque 319 mm3 (low attenuation plaque 15 mm3)). 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 3.  Normal coronary origin  with Left dominance. 4.  CAD-RADS = 3. Left Main: Minimal stenosis (0-24%) due to mixed plaque at the distal segment. LAD: Moderate disease noted within the proximal & mid segments. Distal to apical segments patent. See above for details. First and Second Diagonal branches: Moderate disease. See above for details. LCX: Dominant, patent. RCA: Patent. 5. Study is sent for CT-FFR to further evaluate the LAD and Diagonal distribution. Findings will be performed and reported separately. RECOMMENDATIONS: Consider symptom-guided anti-ischemic pharmacotherapy as well as risk factor modification per guideline directed care. Additional analysis with CT FFR will be submitted. Electronically Signed   By: Tessa Lerner D.O.   On: 06/29/2023 15:43   CT Chest W Contrast  Result Date: 06/28/2023 CLINICAL DATA:  47 year old male with chest pain and shortness of breath. Smoker. Symptoms exacerbated by lying on left side. EXAM: CT CHEST WITH CONTRAST TECHNIQUE: Multidetector CT imaging of the chest was performed during intravenous contrast administration. RADIATION DOSE REDUCTION: This exam was performed according to the departmental dose-optimization program which includes automated exposure control, adjustment of the mA and/or kV according to patient size and/or use of iterative reconstruction technique. CONTRAST:  50mL OMNIPAQUE IOHEXOL 350 MG/ML SOLN COMPARISON:  Chest radiographs 0640 hours today. FINDINGS: Cardiovascular: Calcified coronary artery plaque is evident on series 3, image 100. No cardiomegaly. No pericardial effusion. Negative visible aorta. Proximal great vessels appear negative. Insufficient pulmonary artery contrast on this routine exam. Mediastinum/Nodes: Negative. No mediastinal mass or lymphadenopathy. Lungs/Pleura: Major airways are patent. Symmetric dependent atelectasis in the lungs. No pneumothorax or pleural effusion. Right lateral lung base curvilinear opacity on x-ray appears to correspond to this  atelectasis or scarring. No consolidation or convincing pulmonary inflammation. No suspicious lung nodule. Upper Abdomen: Negative visible liver, gallbladder, spleen, pancreas, adrenal glands, kidneys (small simple fluid density right renal cyst - no follow-up imaging recommended), and bowel in the upper abdomen. Musculoskeletal: No acute osseous abnormality identified. IMPRESSION: 1. Symmetric lung base atelectasis and/or scarring. No acute or inflammatory process identified in the Chest. 2. Calcified coronary artery plaque. Electronically Signed   By: Odessa Fleming M.D.   On: 06/28/2023 10:57   DG Chest 2 View  Result Date: 06/28/2023 CLINICAL DATA:  Chest pain in shortness of breath with exertion. EXAM: CHEST - 2 VIEW COMPARISON:  10/01/2019 FINDINGS: The heart size and mediastinal contours are within normal limits. Mild linear scar like opacity within the lateral right base. No pleural effusion, interstitial edema or airspace disease. The visualized skeletal structures are unremarkable. IMPRESSION: 1. No acute cardiopulmonary disease. 2. Mild linear scar like opacity within the lateral right base. Electronically Signed   By: Signa Kell M.D.   On: 06/28/2023 07:06     Discharge Instructions: Discharge Instructions     Amb Referral to Cardiac Rehabilitation   Complete by: As directed    Diagnosis:  Coronary Stents PTCA     After initial evaluation and assessments completed: Virtual Based Care may be provided alone or in conjunction with Phase 2 Cardiac Rehab based on patient barriers.: Yes   Intensive Cardiac Rehabilitation (ICR) [facility] location only OR Traditional Cardiac Rehabilitation (TCR) *If criteria for ICR are not met will enroll in TCR [facility] only): Yes   Call MD for:   Complete by: As directed    Call MD for:  difficulty breathing, headache or visual disturbances  Complete by: As directed    Call MD for:  extreme fatigue   Complete by: As directed    Call MD for:  hives   Complete by:  As directed    Call MD for:  persistant dizziness or light-headedness   Complete by: As directed    Call MD for:  persistant nausea and vomiting   Complete by: As directed    Call MD for:  redness, tenderness, or signs of infection (pain, swelling, redness, odor or green/yellow discharge around incision site)   Complete by: As directed    Call MD for:  severe uncontrolled pain   Complete by: As directed    Call MD for:  temperature >100.4   Complete by: As directed    Diet - low sodium heart healthy   Complete by: As directed    Discharge instructions   Complete by: As directed     You were seen for cardiac chest pain and found to have unstable angina due to significant blockage in one of your heart's main arteries. Dr. Clifton James from cardiology performed a left heart catheterization and placed a drug eluting stent in the proximal Left Anterior Descending artery. You were started on Dual Antiplatelet Therapy (DAPT) which includes daily Aspirin 81 mg and daily Plavix (Clopidogrel) 75 mg. It is VERY important that you take both of these medications consistently as stopping either could increase your risk of clotting in the stent. It was recommended that you take both of these medications for at least 6 months. You were also started on a medication for coronary artery disease called atorvastatin (taken 80 mg daily).    You will get a call to follow up on an ambulatory referral to cardiac rehabilitation. You have been scheduled for cardiology follow-up at Dr. Gibson Ramp office [facility]) on 07/09/2023 at 1:55 PM located at [address], [address], [place] [address], [phone].   Your blood pressures during your admission have been 120s/80s-90s. We are resuming your amlodipine 10 mg daily. We do not want to resume all of your blood pressure medications at once in case they lower your blood pressure too much or too quickly.  Please STOP taking the  following medications until you can discuss with your doctor at your follow-up visit:  - Valsartan 80 mg daily  - Chlorthalidone 25 mg daily    Please make a hospitalization follow-up appointment with your PCP, Paul Denver, NP within the next 7-10 days. She will help follow up your blood pressure and help you restart your medications if indicated.    Please call 911 or go to the Emergency Department if you develop sudden chest pain or generalized weakness.   We are glad you are feeling better. It was a pleasure serving you during your stay. - Dr. Justin Mend and the Internal Medicine Teaching Service Team   Increase activity slowly   Complete by: As directed       You were seen for cardiac chest pain and found to have unstable angina due to significant blockage in one of your heart's main arteries. Dr. Clifton James from cardiology performed a left heart catheterization and placed a drug eluting stent in the proximal Left Anterior Descending artery. You were started on Dual Antiplatelet Therapy (DAPT) which includes daily Aspirin 81 mg and daily Plavix (Clopidogrel) 75 mg. It is VERY important that you take both of these medications consistently as stopping either could increase your risk of clotting in the stent.  It was recommended that you take both of these medications for at least 6 months. You were also started on a medication for coronary artery disease called atorvastatin (taken 80 mg daily).   You will get a call to follow up on an ambulatory referral to cardiac rehabilitation. You have been scheduled for cardiology follow-up at Dr. Gibson Ramp office [facility]) on 07/09/2023 at 1:55 PM located at [address], [place] [address], [phone].  Your blood pressures during your admission have been 120s/80s-90s. We are resuming your amlodipine 10 mg daily. We do not want to resume all of your blood pressure medications at once in case they  lower your blood pressure too much or too quickly.  Please STOP taking the following medications until you can discuss with your doctor at your follow-up visit:  Valsartan 80 mg daily  Chlorthalidone 25 mg daily   Please make a hospitalization follow-up appointment with your PCP, Paul Denver, NP within the next 7-10 days. She will help follow up your blood pressure and help you restart your medications if indicated.   Please call 911 or go to the Emergency Department if you develop sudden chest pain or generalized weakness.  We are glad you are feeling better. It was a pleasure serving you during your stay. - Dr. Justin Mend and the Internal Medicine Teaching Service Team  Signed: Gerri Acre Colbert Coyer, MD [facility] - PGY1 Pager: [phone] 07/03/2023, 12:08 PM    Please contact the on call pager after 5 pm and on weekends at [phone].

## 2023-07-02 NOTE — Progress Notes (Addendum)
Rounding Note    Patient Name: Paul Molina Date of Encounter: 07/02/2023  [facility] Cardiologist:  New   Subjective   Denies any chest pain or shortness of breath.  This morning showed 20% ostial to proximal LAD, 80% mid LAD followed by 40% mid LAD, 40% OM1 status post PCI stent of the proximal LAD and medical management of other nonobstructive CAD.  Circumflex was dominant.  Now on DAPT with aspirin and Plavix for at least 6 months.  Inpatient Medications    Scheduled Meds:  [MAR Hold] aspirin  81 mg Oral Daily   [MAR Hold] atorvastatin  80 mg Oral Daily   [START ON 07/03/2023] clopidogrel  75 mg Oral Q breakfast   [MAR Hold] colchicine  0.6 mg Oral BID   [MAR Hold] heparin  5,000 Units Subcutaneous Q8H   [MAR Hold] melatonin  3 mg Oral QHS   nitroGLYCERIN       [MAR Hold] pantoprazole  20 mg Oral Daily   Continuous Infusions:  sodium chloride 75 mL/hr at 07/02/23 1130   sodium chloride 0.123 mL/kg/hr (07/02/23 1026)   famotidine 20 mg (07/02/23 1043)   PRN Meds: [MAR Hold] acetaminophen, clopidogrel, famotidine, fentaNYL, Heparin (Porcine) in NaCl, heparin sodium (porcine), hydrALAZINE, iohexol, labetalol, lidocaine (PF), midazolam, nitroGLYCERIN, Radial Cocktail/Verapamil only   Vital Signs    Vitals:   07/02/23 1415 07/02/23 1430 07/02/23 1445 07/02/23 1500  BP: 130/88 (!) 128/94 124/86 (!) 133/98  Pulse: 69 71 71 74  Resp: 14 15 16 12   Temp:      TempSrc:      SpO2: 96% 97% 96% 97%  Weight:      Height:       No intake or output data in the 24 hours ending 07/02/23 1514     07/02/2023    3:05 AM 07/01/2023    4:26 AM 06/30/2023    4:20 AM  Last 3 Weights  Weight (lbs) 177 lb 11.1 oz 179 lb 3.7 oz 177 lb 0.5 oz  Weight (kg) 80.6 kg 81.3 kg 80.3 kg      Telemetry    Normal sinus rhythm- Personally Reviewed  ECG    Sinus rhythm at 66 bpm with LVH and diffuse ST elevation likely related to early repolarization- Personally  Reviewed  Physical Exam   GEN: Well nourished, well developed in no acute distress HEENT: Normal NECK: No JVD; No carotid bruits LYMPHATICS: No lymphadenopathy CARDIAC:RRR, no murmurs, rubs, gallops RESPIRATORY:  Clear to auscultation without rales, wheezing or rhonchi  ABDOMEN: Soft, non-tender, non-distended MUSCULOSKELETAL:  No edema; No deformity  SKIN: Warm and dry NEUROLOGIC:  Alert and oriented x 3 PSYCHIATRIC:  Normal affect   Labs    High Sensitivity Troponin:   Recent Labs  Lab 06/28/23 0620 06/28/23 1134 06/28/23 1954  TROPONINIHS 6 7 6      Chemistry Recent Labs  Lab 06/29/23 0344 06/29/23 1111 06/30/23 0243 07/02/23 0243  NA 135  --  137 133*  K 2.8* 3.7 3.4* 3.4*  CL 100  --  103 101  CO2 25  --  23 22  GLUCOSE 124*  --  86 78  BUN 18  --  17 16  CREATININE 1.28*  --  1.06 1.14  CALCIUM 9.1  --  9.0 9.2  MG 2.0  --   --   --   GFRNONAA >60  --  >60 >60  ANIONGAP 10  --  11 10    Lipids  Recent Labs  Lab 06/28/23 0620  CHOL 218*  TRIG 105  HDL 85  LDLCALC 112*  CHOLHDL 2.6    Hematology Recent Labs  Lab 06/28/23 0620 06/29/23 0344  WBC 9.1 5.4  RBC 4.79 4.72  HGB 15.7 15.4  HCT 44.9 44.1  MCV 93.7 93.4  MCH 32.8 32.6  MCHC 35.0 34.9  RDW 13.5 13.2  PLT 223 199   Thyroid No results for input(s): "TSH", "FREET4" in the last 168 hours.  BNPNo results for input(s): "BNP", "PROBNP" in the last 168 hours.  DDimer  Recent Labs  Lab 06/28/23 0748  DDIMER <0.27     Radiology    CARDIAC CATHETERIZATION  Result Date: 07/02/2023   1st Mrg lesion is 40% stenosed.   Mid LAD-1 lesion is 80% stenosed.   Mid LAD-2 lesion is 40% stenosed.   Ost LAD to Prox LAD lesion is 20% stenosed.   A drug-eluting stent was successfully placed using a SYNERGY XD [id number].   Post intervention, there is a 0% residual stenosis. Severe mid LAD stenosis. Successful PTCA/DES x 1 mid LAD. Large dominant Circumflex. Mild non-obstructive disease in the first  obtuse marginal branch. \ Small non-dominant RCA without obstructive disease Normal LVEDP Recommendations: DAPT with ASA and Plavix for at least six months. Continue statin. Consider discharge in 6 hours.    Cardiac Studies   2D echo 06/26/2021 with EF 55 to 60% with moderate LVH and no focal wall motion abnormalities, normal RV and no evidence of pericardial effusion CT ordered   Patient Profile     47 y.o. male with hx of  HTN, anxiety, tobacco use   Admitted for CP   Assessment & Plan    CP    Atypical mentation with chest pain worse with deep inspiration and when laying flat EKG showed LVH with early repol also some concern for possible acute pericarditis Sensitivity troponin negative x 2 sed rate was normal but CRP was elevated at 8 was started prophylactically on colchicine 0.6 mg twice daily and NSAIDs 2D echo with no evidence of pericardial effusion Review of record he CP and pt started on colchicine and NSAIDS   Coronary Ca++ score at 99%ile;  CT FFR suggestive of significant prox-mid LAD lesion Cath today demonstrated 0% ostial to proximal LAD, 80% mid LAD followed by 40% mid LAD, 40% OM1 status post PCI stent of the proximal LAD and medical management of other nonobstructive CAD.  Circumflex was dominant.  Now on DAPT with aspirin and Plavix for at least 6 months. I have reviewed EKGs and compared to previous EKGs    They are dynamic   there is also LVH present on EKG and confirmed on 2D echo.  There is diffuse ST elevation but no PR depression and I think EKG changes are more likely representative of LVH No evidence of pericardial effusion on 2D echo given high-grade lesion on cath I think we have enough evidence of an etiology of chest pain and will stop patient's colchicine DAPT with aspirin 81 mg daily Plavix 75 mg daily for at least 6 months Continue Atorvastatin 80mg  daily  Hx HTN     BP trending upward especially diastolic blood pressure 133/98 mmHg  Patient he was on  amlodipine 10 mg daily, chlorthalidone 25 mg daily and Diovan 80 mg daily at home Given bump in serum creatinine on admission and contrast exposure during cath we will hold restarting ARB and put him back on amlodipine 10 mg daily  HL     LDL mildly increased at 112 with LDL goal less than 70 given his underlying CAD Started on atorvastatin 80 mg daily this admission Plan ALT in 6 weeks  Tobacco abuse   Counselled on cessation.  Plan for Early discharge later today once he is finished his bedrest.  Will have him follow-up with extender or Dr. Tenny Craw in 7 to 10 days.     For questions or updates, please contact [facility] Please consult [email or web address] for contact info under        Signed, Armanda Magic, MD  07/02/2023, 3:14 PM

## 2023-07-02 NOTE — Plan of Care (Signed)
  Problem: Activity: Goal: Risk for activity intolerance will decrease Outcome: Progressing   

## 2023-07-03 ENCOUNTER — Encounter (HOSPITAL_COMMUNITY): Payer: Self-pay | Admitting: Cardiovascular Disease

## 2023-07-03 ENCOUNTER — Telehealth: Payer: Self-pay

## 2023-07-03 ENCOUNTER — Encounter: Payer: Self-pay | Admitting: Nurse Practitioner

## 2023-07-03 DIAGNOSIS — Z955 Presence of coronary angioplasty implant and graft: Secondary | ICD-10-CM

## 2023-07-03 DIAGNOSIS — E876 Hypokalemia: Secondary | ICD-10-CM | POA: Insufficient documentation

## 2023-07-03 DIAGNOSIS — I25110 Atherosclerotic heart disease of native coronary artery with unstable angina pectoris: Secondary | ICD-10-CM | POA: Diagnosis present

## 2023-07-03 NOTE — Transitions of Care (Post Inpatient/ED Visit) (Signed)
   07/03/2023  Name: Paul Molina MRN: [id number] DOB: Apr 07, 1976  Today's TOC FU Call Status: Today's TOC FU Call Status:: Successful TOC FU Call Completed TOC FU Call Complete Date: 07/03/23 Patient's Name and Date of Birth confirmed.  Transition Care Management Follow-up Telephone Call Date of Discharge: 07/02/23 Discharge Facility: [facility]) Type of Discharge: Inpatient Admission Primary Inpatient Discharge Diagnosis:: Chest pain, T wave inversion in EKG, underwent left heart catheterization on 11/25 How have you been since you were released from the hospital?: Better Any questions or concerns?: No  Items Reviewed: Did you receive and understand the discharge instructions provided?: Yes Medications obtained,verified, and reconciled?: Yes (Medications Reviewed) Any new allergies since your discharge?: No Dietary orders reviewed?: Yes Type of Diet Ordered:: Low sodium, heart healthy diet Do you have support at home?: Yes People in Home: significant other  Medications Reviewed Today: Medications Reviewed Today     Reviewed by Marcos Eke, RN (Registered Nurse) on 07/03/23 at 1035  Med List Status: <None>   Medication Order Taking? Sig Documenting Provider Last Dose Status Informant  amLODipine (NORVASC) 10 MG tablet [id number] Yes Take 1 tablet (10 mg total) by mouth daily. Claiborne Rigg, NP Taking Active Self, Pharmacy Records  aspirin 81 MG chewable tablet [id number] Yes Chew 1 tablet (81 mg total) by mouth daily. Philomena Doheny, MD Taking Active   atorvastatin (LIPITOR) 80 MG tablet [id number] Yes Take 1 tablet (80 mg total) by mouth daily. Philomena Doheny, MD Taking Active   clopidogrel (PLAVIX) 75 MG tablet [id number] Yes Take 1 tablet (75 mg total) by mouth daily with breakfast. Colbert Coyer, Priscila, MD Taking Active             Home Care and Equipment/Supplies: Were Home Health Services Ordered?: NA Any new equipment or  medical supplies ordered?: NA  Functional Questionnaire: Do you need assistance with bathing/showering or dressing?: No Do you need assistance with meal preparation?: No Do you need assistance with eating?: No Do you have difficulty maintaining continence: No Do you need assistance with getting out of bed/getting out of a chair/moving?: No Do you have difficulty managing or taking your medications?: No  Follow up appointments reviewed: PCP Follow-up appointment confirmed?: No (Advised patient to call his PCP, Bertram Denver today to make an appointment for follow up) MD Provider Line Number:[phone] Given: No Specialist Hospital Follow-up appointment confirmed?: Yes Date of Specialist follow-up appointment?: 07/09/23 Follow-Up Specialty Provider:: Cardiology, Radiology - has an appointment to see Ronie Spies PA-C for hospital follow up Do you need transportation to your follow-up appointment?: No Do you understand care options if your condition(s) worsen?: Yes-patient verbalized understanding    Alyse Low, RN, BA, [facility], CRRN [facility] Population Health Care Management Coordinator, Transition of Care Ph # [phone]

## 2023-07-04 MED FILL — Nitroglycerin IV Soln 100 MCG/ML in D5W: INTRA_ARTERIAL | Qty: 10 | Status: AC

## 2023-07-04 NOTE — Telephone Encounter (Signed)
Patient contacted regarding discharge from [facility] on 07/02/23.  Patient understands to follow up with provider Ronie Spies, PA on 07/09/23 at 1:55 pm at [facility].  Sent pt a my chart message with address per his request.  Patient understands discharge instructions? Yes  Patient understands medications and regiment? Yes Reports was told to stop Valsartan 80 mg and Chlorthalidone 25 mg pt was on these meds prior to Hospital stay.  Reports BP 133/89-76.  Patient understands to bring all medications to this visit? Yes  Ask patient:  Are you enrolled in My Chart: Yes

## 2023-07-08 DIAGNOSIS — Z419 Encounter for procedure for purposes other than remedying health state, unspecified: Secondary | ICD-10-CM | POA: Diagnosis not present

## 2023-07-08 NOTE — Progress Notes (Unsigned)
Cardiology Office Note    Date:  07/08/2023   ID:  Paul Molina, DOB Apr 25, 1976, MRN [id number]  PCP:  Claiborne Rigg, NP  Cardiologist:  None  Electrophysiologist:  None   Chief Complaint: ***  History of Present Illness:   Paul Molina is a 47 y.o. male with history of ***  Labwork independently reviewed:    Past History   Past Medical History:  Diagnosis Date   Anxiety    Hypertension    Hypertensive urgency 10/01/2019   TMJ (temporomandibular joint syndrome) 10/13/2021    Past Surgical History:  Procedure Laterality Date   CORONARY STENT INTERVENTION N/A 07/02/2023   Procedure: CORONARY STENT INTERVENTION;  Surgeon: Kathleene Hazel, MD;  Location: [facility] INVASIVE CV LAB;  Service: Cardiovascular;  Laterality: N/A;   LEFT HEART CATH AND CORONARY ANGIOGRAPHY N/A 07/02/2023   Procedure: LEFT HEART CATH AND CORONARY ANGIOGRAPHY;  Surgeon: Kathleene Hazel, MD;  Location: [facility] INVASIVE CV LAB;  Service: Cardiovascular;  Laterality: N/A;   NO PAST SURGERIES      Current Medications: No outpatient medications have been marked as taking for the 07/09/23 encounter (Appointment) with Laurann Montana, PA-C.   ***   Allergies:   Patient has no known allergies.   Social History   Socioeconomic History   Marital status: Single    Spouse name: Not on file   Number of children: 2   Years of education: Not on file   Highest education level: Some college, no degree  Occupational History    Employer: [employer]  Tobacco Use   Smoking status: Some Days    Current packs/day: 0.25    Average packs/day: 0.3 packs/day for 28.0 years (7.0 ttl pk-yrs)    Types: Cigarettes   Smokeless tobacco: Never  Vaping Use   Vaping status: Never Used  Substance and Sexual Activity   Alcohol use: Yes    Comment: drinks 24 can beer daily   Drug use: Not Currently   Sexual activity: Yes  Other Topics Concern   Not on file  Social History Narrative   Not on file    Social Determinants of Health   Financial Resource Strain: High Risk (12/23/2022)   Overall Financial Resource Strain (CARDIA)    Difficulty of Paying Living Expenses: Hard  Food Insecurity: No Food Insecurity (07/03/2023)   Hunger Vital Sign    Worried About Running Out of Food in the Last Year: Never true    Ran Out of Food in the Last Year: Never true  Transportation Needs: No Transportation Needs (07/03/2023)   PRAPARE - Administrator, Civil Service (Medical): No    Lack of Transportation (Non-Medical): No  Physical Activity: Unknown (12/23/2022)   Exercise Vital Sign    Days of Exercise per Week: 0 days    Minutes of Exercise per Session: Not on file  Stress: No Stress Concern Present (12/23/2022)   [employer] of Occupational Health - Occupational Stress Questionnaire    Feeling of Stress : Not at all  Social Connections: Socially Isolated (12/23/2022)   Social Connection and Isolation Panel [NHANES]    Frequency of Communication with Friends and Family: More than three times a week    Frequency of Social Gatherings with Friends and Family: Never    Attends Religious Services: Never    Database administrator or Organizations: No    Attends Engineer, structural: Not on file    Marital Status: Never married  Family History:  The patient's ***family history includes Hypertension in his father and mother; Stroke in his father.  ROS:   Please see the history of present illness. Otherwise, review of systems is positive for ***.  All other systems are reviewed and otherwise negative.    EKG(s)/Additional Testing   EKG:  EKG is ordered today, personally reviewed, demonstrating ***  CV Studies: Cardiac studies reviewed are outlined and summarized above. Otherwise please see EMR for full report.  Recent Labs: 12/27/2022: ALT 43 06/29/2023: Hemoglobin 15.4; Magnesium 2.0; Platelets 199 07/02/2023: BUN 16; Creatinine, Ser 1.14; Potassium 3.4;  Sodium 133  Recent Lipid Panel    Component Value Date/Time   CHOL 218 (H) 06/28/2023 0620   CHOL 212 (H) 12/27/2022 1508   TRIG 105 06/28/2023 0620   HDL 85 06/28/2023 0620   HDL 53 12/27/2022 1508   CHOLHDL 2.6 06/28/2023 0620   VLDL 21 06/28/2023 0620   LDLCALC 112 (H) 06/28/2023 0620   LDLCALC 138 (H) 12/27/2022 1508    PHYSICAL EXAM:    VS:  There were no vitals taken for this visit.  BMI: There is no height or weight on file to calculate BMI.  GEN: Well nourished, well developed male in no acute distress HEENT: normocephalic, atraumatic Neck: no JVD, carotid bruits, or masses Cardiac: ***RRR; no murmurs, rubs, or gallops, no edema  Respiratory:  clear to auscultation bilaterally, normal work of breathing GI: soft, nontender, nondistended, + BS MS: no deformity or atrophy Skin: warm and dry, no rash Neuro:  Alert and Oriented x 3, Strength and sensation are intact, follows commands Psych: euthymic mood, full affect  Wt Readings from Last 3 Encounters:  07/02/23 177 lb 11.1 oz (80.6 kg)  12/27/22 195 lb (88.5 kg)  03/10/22 198 lb 6.4 oz (90 kg)     ASSESSMENT & PLAN:   ***  {The patient has an active order for outpatient cardiac rehabilitation.   Please indicate if the patient is ready to start. Do NOT delete this.  It will auto delete.  Refresh note, then sign.              Click here to document readiness and see contraindications.  :1}  Cardiac Rehabilitation Eligibility Assessment      Disposition: F/u with ***   Medication Adjustments/Labs and Tests Ordered: Current medicines are reviewed at length with the patient today.  Concerns regarding medicines are outlined above. Medication changes, Labs and Tests ordered today are summarized above and listed in the Patient Instructions accessible in Encounters.   Signed, Laurann Montana, PA-C  07/08/2023 11:55 AM    [facility] Phone: [phone]; Fax: [phone]

## 2023-07-09 ENCOUNTER — Ambulatory Visit: Payer: 59 | Admitting: Physician Assistant

## 2023-07-09 NOTE — Telephone Encounter (Signed)
I am not in clinic on 12/4. I have not met patient before so OK to reassign to next availability. Thank you!

## 2023-07-20 ENCOUNTER — Telehealth: Payer: Self-pay | Admitting: Internal Medicine

## 2023-07-20 NOTE — Telephone Encounter (Signed)
Paul Molina with Cardiac Rehab is calling stating the medicaid form faxed on 12/02 was received back with only one section completed. She states she is re-faxing it today and is requesting it be fully filled out before sending back.   Please advise.

## 2023-07-29 ENCOUNTER — Other Ambulatory Visit: Payer: Self-pay

## 2023-07-29 ENCOUNTER — Emergency Department (HOSPITAL_COMMUNITY): Payer: 59

## 2023-07-29 ENCOUNTER — Emergency Department (HOSPITAL_COMMUNITY)
Admission: EM | Admit: 2023-07-29 | Discharge: 2023-07-29 | Disposition: A | Discharge status: Home / Self Care | Payer: 59 | Attending: Emergency Medicine | Admitting: Emergency Medicine

## 2023-07-29 ENCOUNTER — Encounter (HOSPITAL_COMMUNITY): Payer: Self-pay

## 2023-07-29 DIAGNOSIS — I4891 Unspecified atrial fibrillation: Secondary | ICD-10-CM | POA: Diagnosis not present

## 2023-07-29 DIAGNOSIS — R000 Tachycardia, unspecified: Secondary | ICD-10-CM | POA: Diagnosis not present

## 2023-07-29 DIAGNOSIS — R29818 Other symptoms and signs involving the nervous system: Secondary | ICD-10-CM | POA: Diagnosis not present

## 2023-07-29 DIAGNOSIS — I6523 Occlusion and stenosis of bilateral carotid arteries: Secondary | ICD-10-CM | POA: Diagnosis not present

## 2023-07-29 DIAGNOSIS — F419 Anxiety disorder, unspecified: Secondary | ICD-10-CM | POA: Insufficient documentation

## 2023-07-29 DIAGNOSIS — R202 Paresthesia of skin: Secondary | ICD-10-CM | POA: Insufficient documentation

## 2023-07-29 DIAGNOSIS — R002 Palpitations: Secondary | ICD-10-CM | POA: Diagnosis not present

## 2023-07-29 DIAGNOSIS — Z7982 Long term (current) use of aspirin: Secondary | ICD-10-CM | POA: Diagnosis not present

## 2023-07-29 LAB — CBC
HCT: 43.6 % (ref 39.0–52.0)
Hemoglobin: 15.5 g/dL (ref 13.0–17.0)
MCH: 33 pg (ref 26.0–34.0)
MCHC: 35.6 g/dL (ref 30.0–36.0)
MCV: 92.8 fL (ref 80.0–100.0)
Platelets: 303 10*3/uL (ref 150–400)
RBC: 4.7 MIL/uL (ref 4.22–5.81)
RDW: 12.8 % (ref 11.5–15.5)
WBC: 8.6 10*3/uL (ref 4.0–10.5)
nRBC: 0 % (ref 0.0–0.2)

## 2023-07-29 LAB — D-DIMER, QUANTITATIVE: D-Dimer, Quant: <0.27 ug{FEU}/mL (ref 0.00–0.50)

## 2023-07-29 LAB — TROPONIN I (HIGH SENSITIVITY)
Troponin I (High Sensitivity): 8 ng/L (ref <18)
Troponin I (High Sensitivity): 15 ng/L (ref <18)

## 2023-07-29 LAB — BASIC METABOLIC PANEL
Anion gap: 20 — ABNORMAL HIGH (ref 5–15)
BUN: 7 mg/dL (ref 6–20)
CO2: 19 mmol/L — ABNORMAL LOW (ref 22–32)
Calcium: 9.5 mg/dL (ref 8.9–10.3)
Chloride: 95 mmol/L — ABNORMAL LOW (ref 98–111)
Creatinine, Ser: 0.98 mg/dL (ref 0.61–1.24)
GFR, Estimated: >60 mL/min (ref >60)
Glucose, Bld: 111 mg/dL — ABNORMAL HIGH (ref 70–99)
Potassium: 3.1 mmol/L — ABNORMAL LOW (ref 3.5–5.1)
Sodium: 134 mmol/L — ABNORMAL LOW (ref 135–145)

## 2023-07-29 MED ORDER — METOPROLOL TARTRATE 25 MG PO TABS
25.0000 mg | ORAL_TABLET | ORAL | Status: AC
  Administered 2023-07-29: 25 mg via ORAL
  Filled 2023-07-29: qty 1

## 2023-07-29 MED ORDER — METOPROLOL TARTRATE 25 MG PO TABS: 25.0000 mg | ORAL_TABLET | Freq: Two times a day (BID) | ORAL | 2 refills | Status: DC

## 2023-07-29 NOTE — ED Provider Triage Note (Signed)
Emergency Medicine Provider Triage Evaluation Note  Tige Costilow , a 47 y.o. male  was evaluated in triage.  Pt complains of racing heart and left hand numbness/tingling. Started around 11PM.  Review of Systems  Positive: anxiousness Negative: Chest pain  Physical Exam  BP (!) 155/105 (BP Location: Right Arm)   Pulse (!) 134   Temp 97.8 F (36.6 C)   Resp 19   Ht 6' (1.829 m)   Wt 79.4 kg   SpO2 100%   BMI 23.73 kg/m  Gen:   Awake, very anxious Resp:  Normal effort  MSK:   Moves extremities without difficulty  Other:  No focal neuro deficit  Medical Decision Making  Medically screening exam initiated at 2:20 AM.  Appropriate orders placed.  Brayde Volante was informed that the remainder of the evaluation will be completed by another provider, this initial triage assessment does not replace that evaluation, and the importance of remaining in the ED until their evaluation is complete.  Recent cardiac stent, EKG with tachycardia but no obvious ischemia. Numbness left hand, band-like pressure left arm, no focal sensory or motor deficit. CT head and cardiac workup initiated.   Gilda Crease, MD 07/29/23 Earle Gell

## 2023-07-29 NOTE — ED Triage Notes (Addendum)
Patient reports feeling like his heart feels like it racing. Also states his arm started feeling weird around 2300. Patient is not specific about the the feeling. Patient was seen here 1 month ago and had a stent placed. Friday he felt normal. Saturday morning he felt "not normal, like something was off".

## 2023-07-29 NOTE — ED Provider Notes (Addendum)
[place] EMERGENCY DEPARTMENT AT [facility] Provider Note   CSN: [id number] Arrival date & time: 07/29/23  0149     History  Chief Complaint  Patient presents with   Palpitations   Numbness    Paul Molina is a 47 y.o. male.  Patient presents to the emergency department stating that he had onset of heart palpitations while at home this evening.  Patient reports that his heart is racing and pounding in his chest but he is not experiencing chest pain.  He is concerned because he had cardiac stenting a month ago.  The symptoms are not similar to the cardiac symptoms he was experiencing.  He does, however, reports tingling in the fingers of the left hand and he has had a sensation of something wrapping around his arm, pressure-like at the bicep area.       Home Medications Prior to Admission medications   Medication Sig Start Date End Date Taking? Authorizing Provider  amLODipine (NORVASC) 10 MG tablet Take 1 tablet (10 mg total) by mouth daily. 12/27/22   Claiborne Rigg, NP  aspirin 81 MG chewable tablet Chew 1 tablet (81 mg total) by mouth daily. 07/03/23   Philomena Doheny, MD  atorvastatin (LIPITOR) 80 MG tablet Take 1 tablet (80 mg total) by mouth daily. 07/03/23   Philomena Doheny, MD  clopidogrel (PLAVIX) 75 MG tablet Take 1 tablet (75 mg total) by mouth daily with breakfast. 07/03/23   Philomena Doheny, MD      Allergies    Patient has no known allergies.    Review of Systems   Review of Systems  Physical Exam Updated Vital Signs BP (!) 151/93   Pulse 80   Temp 97.8 F (36.6 C)   Resp 15   Ht 6' (1.829 m)   Wt 79.4 kg   SpO2 100%   BMI 23.73 kg/m  Physical Exam Vitals and nursing note reviewed.  Constitutional:      General: He is in acute distress.     Appearance: He is well-developed.  HENT:     Head: Normocephalic and atraumatic.     Mouth/Throat:     Mouth: Mucous membranes are moist.  Eyes:     General:  Vision grossly intact. Gaze aligned appropriately.     Extraocular Movements: Extraocular movements intact.     Conjunctiva/sclera: Conjunctivae normal.  Cardiovascular:     Rate and Rhythm: Regular rhythm. Tachycardia present.     Pulses: Normal pulses.     Heart sounds: Normal heart sounds, S1 normal and S2 normal. No murmur heard.    No friction rub. No gallop.  Pulmonary:     Effort: Pulmonary effort is normal. No respiratory distress.     Breath sounds: Normal breath sounds.  Abdominal:     Palpations: Abdomen is soft.     Tenderness: There is no abdominal tenderness. There is no guarding or rebound.     Hernia: No hernia is present.  Musculoskeletal:        General: No swelling.     Cervical back: Full passive range of motion without pain, normal range of motion and neck supple. No pain with movement, spinous process tenderness or muscular tenderness. Normal range of motion.     Right lower leg: No edema.     Left lower leg: No edema.  Skin:    General: Skin is warm and dry.     Capillary Refill: Capillary refill takes less than 2 seconds.  Findings: No ecchymosis, erythema, lesion or wound.  Neurological:     Mental Status: He is alert and oriented to person, place, and time.     GCS: GCS eye subscore is 4. GCS verbal subscore is 5. GCS motor subscore is 6.     Cranial Nerves: Cranial nerves 2-12 are intact.     Sensory: Sensation is intact.     Motor: Motor function is intact. No weakness or abnormal muscle tone.     Coordination: Coordination is intact.     Comments: Extraocular muscle movement: normal No visual field cut Pupils: equal and reactive both direct and consensual response is normal No nystagmus present    Sensory function is intact to light touch, pinprick Proprioception intact  Grip strength 5/5 symmetric in upper extremities No pronator drift Normal finger to nose bilaterally  Lower extremity strength 5/5 against gravity Normal heel to shin  bilaterally  Gait: normal   Psychiatric:        Mood and Affect: Mood is anxious.        Speech: Speech normal.        Behavior: Behavior normal.     ED Results / Procedures / Treatments   Labs (all labs ordered are listed, but only abnormal results are displayed) Labs Reviewed  BASIC METABOLIC PANEL - Abnormal; Notable for the following components:      Result Value   Sodium 134 (*)    Potassium 3.1 (*)    Chloride 95 (*)    CO2 19 (*)    Glucose, Bld 111 (*)    Anion gap 20 (*)    All other components within normal limits  CBC  D-DIMER, QUANTITATIVE  TROPONIN I (HIGH SENSITIVITY)  TROPONIN I (HIGH SENSITIVITY)    EKG EKG Interpretation Date/Time:  Sunday July 29 2023 01:58:05 EST Ventricular Rate:  123 PR Interval:  138 QRS Duration:  84 QT Interval:  332 QTC Calculation: 475 R Axis:   90  Text Interpretation: Sinus tachycardia Rightward axis Borderline ECG When compared with ECG of 02-Jul-2023 11:06, PREVIOUS ECG IS PRESENT Confirmed by Gilda Crease ([id number]) on 07/29/2023 3:47:54 AM  Radiology DG Chest Port 1 View Result Date: 07/29/2023 CLINICAL DATA:  Atrial fibrillation EXAM: PORTABLE CHEST 1 VIEW COMPARISON:  06/28/2023 FINDINGS: The heart size and mediastinal contours are within normal limits. Both lungs are clear. The visualized skeletal structures are unremarkable. IMPRESSION: No active disease. Electronically Signed   By: Charlett Nose M.D.   On: 07/29/2023 03:27   CT HEAD WO CONTRAST ( ) Result Date: 07/29/2023 CLINICAL DATA:  Acute neurologic deficit EXAM: CT HEAD WITHOUT CONTRAST TECHNIQUE: Contiguous axial images were obtained from the base of the skull through the vertex without intravenous contrast. RADIATION DOSE REDUCTION: This exam was performed according to the departmental dose-optimization program which includes automated exposure control, adjustment of the mA and/or kV according to patient size and/or use of iterative  reconstruction technique. COMPARISON:  None Available. FINDINGS: Brain: Subacute to chronic infarct of the right thalamus. Brain parenchyma is otherwise normal. No mass, hemorrhage or extra-axial collection. Normal CSF spaces. Vascular: Atherosclerotic calcification of the internal carotid arteries at the skull base. No abnormal hyperdensity of the major intracranial arteries or dural venous sinuses. Skull: The visualized skull base, calvarium and extracranial soft tissues are normal. Sinuses/Orbits: No fluid levels or advanced mucosal thickening of the visualized paranasal sinuses. No mastoid or middle ear effusion. Normal orbits. Other: None. IMPRESSION: No acute hemorrhage. 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 Electronically Signed   By: Deatra Robinson M.D.   On: 07/29/2023 03:09    Procedures Procedures    Medications Ordered in ED Medications - No data to display  ED Course/ Medical Decision Making/ A&P                                 Medical Decision Making Amount and/or Complexity of Data Reviewed Labs: ordered. Radiology: ordered.  Risk Prescription drug management.   Differential Diagnosis considered includes, but not limited to: STEMI; NSTEMI; myocarditis; pericarditis; pulmonary embolism; TIA; CVA  Presents with heart palpitations.  Patient's heart rate in the 130s at arrival.  He is, however, extremely anxious.  He is concerned about cardiac etiology because he recently had cardiac stenting.  His symptoms tonight, however, are not anything similar to what he had prior to stenting.  At that time he was having crushing chest pain.  He has no chest pain tonight.  Record review reveals that he had an 80% mid LAD lesion stented with no significant obstructive lesions elsewhere in his anatomy.  Neurologic exam is normal.  He did have some tingling in the fingers but there is no diffuse arm sensory deficit, no weakness.  He did not have any facial numbness,  weakness or asymmetry.  No leg involvement.  Doubt stroke or TIA.  CT head unremarkable.  EKG without ischemic changes, is tachycardic.  This has resolved without intervention.  Felt most likely to be secondary to his anxiety level.  PE was considered although felt to be fairly unlikely.  He is not short of breath, hypoxic.  D-dimer negative.  Patient had medication changes when he left the hospital.  His chlorthalidone and Diovan were stopped.  His blood pressure has been running 150s over 90s at home.  That is approximately where he has been throughout the visit here today.  Based on his persistent mild elevated blood pressures, palpitations, will add Lopressor at least temporarily until he can follow-up with cardiology.       Final Clinical Impression(s) / ED Diagnoses Final diagnoses:  Palpitations  Anxiety    Rx / DC Orders ED Discharge Orders          Ordered    Ambulatory referral to Cardiology       Comments: If you have not heard from the Cardiology office within the next 72 hours please call [phone].   07/29/23 0535              Gilda Crease, MD 07/29/23 0535    Gilda Crease, MD 07/29/23 9629    Gilda Crease, MD 07/29/23 [phone]

## 2023-07-31 ENCOUNTER — Other Ambulatory Visit: Payer: Self-pay

## 2023-08-08 DIAGNOSIS — Z419 Encounter for procedure for purposes other than remedying health state, unspecified: Secondary | ICD-10-CM | POA: Diagnosis not present

## 2023-08-13 ENCOUNTER — Telehealth: Payer: Self-pay

## 2023-08-13 NOTE — Progress Notes (Signed)
 Transition Care Management Unsuccessful Follow-up Telephone Call  Date of discharge and from where:  07/29/2023 [facility]  Attempts:  1st Attempt  Reason for unsuccessful TCM follow-up call:  Left voice message  Noha Milberger [facility]  [facility] Resource Care Guide Direct Dial: [phone]  Website: [email or web address]

## 2023-08-14 ENCOUNTER — Telehealth: Payer: Self-pay

## 2023-08-14 NOTE — Telephone Encounter (Signed)
 Forms printed from [employer]wil have Dr Tenny Craw sign today.

## 2023-08-14 NOTE — Progress Notes (Signed)
 Transition Care Management Follow-up Telephone Call Date of discharge and from where: 07/29/2023 [facility] How have you been since you were released from the hospital? Patient stated he is feeling better. Any questions or concerns? No  Items Reviewed: Did the pt receive and understand the discharge instructions provided? Yes  Medications obtained and verified? Yes  Other? No  Any new allergies since your discharge? No  Dietary orders reviewed? Yes Do you have support at home? Yes   Follow up appointments reviewed:  PCP Hospital f/u appt confirmed? No  Scheduled to see  on  @ . Specialist Hospital f/u appt confirmed?  Patient has the number for the Cardiology office.  Scheduled to see  on  @ . Are transportation arrangements needed? No  If their condition worsens, is the pt aware to call PCP or go to the Emergency Dept.? Yes Was the patient provided with contact information for the PCP's office or ED? Yes Was to pt encouraged to call back with questions or concerns? Yes   Shawnelle Spoerl [facility]  [facility] Guide Direct Dial: [phone]  Website: [email or web address]

## 2023-08-27 NOTE — Telephone Encounter (Signed)
Cardiac Rehab forms signed and faxed.

## 2023-09-08 DIAGNOSIS — Z419 Encounter for procedure for purposes other than remedying health state, unspecified: Secondary | ICD-10-CM | POA: Diagnosis not present

## 2023-10-02 ENCOUNTER — Other Ambulatory Visit: Payer: Self-pay

## 2023-10-02 ENCOUNTER — Other Ambulatory Visit: Payer: Self-pay | Admitting: Nurse Practitioner

## 2023-10-02 ENCOUNTER — Encounter: Payer: Self-pay | Admitting: Nurse Practitioner

## 2023-10-02 DIAGNOSIS — I10 Essential (primary) hypertension: Secondary | ICD-10-CM

## 2023-10-03 ENCOUNTER — Other Ambulatory Visit: Payer: Self-pay

## 2023-10-03 MED ORDER — AMLODIPINE BESYLATE 10 MG PO TABS
10.0000 mg | ORAL_TABLET | Freq: Every day | ORAL | 0 refills | Status: DC
  Filled 2023-10-03: qty 30, 30d supply, fill #0

## 2023-10-04 ENCOUNTER — Other Ambulatory Visit: Payer: Self-pay | Admitting: Nurse Practitioner

## 2023-10-04 ENCOUNTER — Other Ambulatory Visit: Payer: Self-pay

## 2023-10-04 ENCOUNTER — Telehealth: Payer: Self-pay

## 2023-10-04 MED ORDER — METOPROLOL TARTRATE 25 MG PO TABS
25.0000 mg | ORAL_TABLET | Freq: Two times a day (BID) | ORAL | 0 refills | Status: DC
  Filled 2023-10-04: qty 60, 30d supply, fill #0
  Filled 2023-10-31 – 2023-11-12 (×2): qty 60, 30d supply, fill #1

## 2023-10-04 MED ORDER — CLOPIDOGREL BISULFATE 75 MG PO TABS
75.0000 mg | ORAL_TABLET | Freq: Every day | ORAL | 0 refills | Status: DC
  Filled 2023-10-04: qty 30, 30d supply, fill #0
  Filled 2023-10-31 – 2023-11-12 (×2): qty 30, 30d supply, fill #1

## 2023-10-04 MED ORDER — ATORVASTATIN CALCIUM 80 MG PO TABS
80.0000 mg | ORAL_TABLET | Freq: Every day | ORAL | 0 refills | Status: DC
  Filled 2023-10-04: qty 30, 30d supply, fill #0
  Filled 2023-10-31 – 2023-11-12 (×2): qty 30, 30d supply, fill #1

## 2023-10-04 MED ORDER — ASPIRIN 81 MG PO CHEW
81.0000 mg | CHEWABLE_TABLET | Freq: Every day | ORAL | 1 refills | Status: DC
  Filled 2023-11-13: qty 90, 90d supply, fill #0
  Filled 2024-01-04: qty 30, 30d supply, fill #0
  Filled 2024-02-05: qty 30, 30d supply, fill #1

## 2023-10-04 NOTE — Telephone Encounter (Signed)
 Patient called and VM was left informing patient that medication has been refilled.      Copied from CRM [id number]. Topic: Clinical - Prescription Issue >> Oct 03, 2023  3:02 PM Shelah Lewandowsky wrote: Reason for CRM: Pharmacy states need approval from doctor for these prescriptions  atorvastatin (LIPITOR) 80 MG tablet clopidogrel (PLAVIX) 75 MG tablet metoprolol tartrate (LOPRESSOR) 25 MG tablet Patient has questions about blood pressure medications, it has been going up and he has questions about medications, please call  [phone]

## 2023-10-04 NOTE — Telephone Encounter (Signed)
 I have sent the refills. Please call him with the message below. Thanks

## 2023-10-04 NOTE — Telephone Encounter (Signed)
 Ok to refill for 60 days to give him time to see Cardiology. He has to make an appt with them as soon as possible. He just had a heart attack. This is very serious and he should not be cancelling his cardiology appointments right after having a stent placed in his heart.

## 2023-10-06 DIAGNOSIS — Z419 Encounter for procedure for purposes other than remedying health state, unspecified: Secondary | ICD-10-CM | POA: Diagnosis not present

## 2023-10-31 ENCOUNTER — Other Ambulatory Visit: Payer: Self-pay

## 2023-10-31 ENCOUNTER — Other Ambulatory Visit: Payer: Self-pay | Admitting: Family Medicine

## 2023-10-31 DIAGNOSIS — I10 Essential (primary) hypertension: Secondary | ICD-10-CM

## 2023-11-08 ENCOUNTER — Other Ambulatory Visit: Payer: Self-pay

## 2023-11-09 ENCOUNTER — Other Ambulatory Visit: Payer: Self-pay

## 2023-11-12 ENCOUNTER — Other Ambulatory Visit: Payer: Self-pay

## 2023-11-12 ENCOUNTER — Other Ambulatory Visit: Payer: Self-pay | Admitting: Family Medicine

## 2023-11-12 DIAGNOSIS — I10 Essential (primary) hypertension: Secondary | ICD-10-CM

## 2023-11-13 ENCOUNTER — Other Ambulatory Visit: Payer: Self-pay

## 2023-11-13 ENCOUNTER — Other Ambulatory Visit: Payer: Self-pay | Admitting: Family Medicine

## 2023-11-13 DIAGNOSIS — I10 Essential (primary) hypertension: Secondary | ICD-10-CM

## 2023-11-15 ENCOUNTER — Other Ambulatory Visit: Payer: Self-pay

## 2023-11-17 ENCOUNTER — Other Ambulatory Visit: Payer: Self-pay | Admitting: Family Medicine

## 2023-11-17 DIAGNOSIS — I10 Essential (primary) hypertension: Secondary | ICD-10-CM

## 2023-11-17 DIAGNOSIS — Z419 Encounter for procedure for purposes other than remedying health state, unspecified: Secondary | ICD-10-CM | POA: Diagnosis not present

## 2023-11-19 NOTE — Telephone Encounter (Signed)
 Requested medications are due for refill today.  yes  Requested medications are on the active medications list.  yes  Last refill. 10/03/2023 #30 0 rf  Future visit scheduled.   no  Notes to clinic.  Pt is overdue for an ov.    Requested Prescriptions  Pending Prescriptions Disp Refills   amLODipine (NORVASC) 10 MG tablet 30 tablet 0    Sig: Take 1 tablet (10 mg total) by mouth daily. Please schedule appt with Zelda Fleming for additional refills.     Cardiovascular: Calcium Channel Blockers 2 Failed - 11/19/2023  2:27 PM      Failed - Last BP in normal range    BP Readings from Last 1 Encounters:  07/29/23 (!) 158/103         Failed - Valid encounter within last 6 months    Recent Outpatient Visits           10 months ago Physical exam, annual   [facility] [place]. [facility] Collins Dean, NP   1 year ago Primary hypertension   [facility] [place]. [facility] Collins Dean, NP   1 year ago Essential hypertension   [facility] [place]. [facility] Valente Gaskin, RPH-CPP   1 year ago Essential hypertension   [facility] [place]. [facility] Valente Gaskin, RPH-CPP   1 year ago Essential hypertension   [facility] [place]. [facility] Waka, [place] L, RPH-CPP              Passed - Last Heart Rate in normal range    Pulse Readings from Last 1 Encounters:  07/29/23 88

## 2023-11-20 ENCOUNTER — Other Ambulatory Visit: Payer: Self-pay

## 2023-11-28 ENCOUNTER — Other Ambulatory Visit: Payer: Self-pay | Admitting: Family Medicine

## 2023-11-28 ENCOUNTER — Other Ambulatory Visit: Payer: Self-pay

## 2023-11-28 ENCOUNTER — Telehealth: Payer: Self-pay | Admitting: Nurse Practitioner

## 2023-11-28 ENCOUNTER — Encounter: Payer: Self-pay | Admitting: Nurse Practitioner

## 2023-11-28 ENCOUNTER — Other Ambulatory Visit: Payer: Self-pay | Admitting: Nurse Practitioner

## 2023-11-28 DIAGNOSIS — I10 Essential (primary) hypertension: Secondary | ICD-10-CM

## 2023-11-28 MED ORDER — AMLODIPINE BESYLATE 10 MG PO TABS
10.0000 mg | ORAL_TABLET | Freq: Every day | ORAL | 0 refills | Status: DC
  Filled 2023-11-28: qty 30, 30d supply, fill #0

## 2023-11-28 NOTE — Addendum Note (Signed)
 Addended by: Theotis Flake F on: 11/28/2023 03:12 PM   Modules accepted: Orders

## 2023-11-28 NOTE — Telephone Encounter (Signed)
 Called patient to schedule appointment per previous refill request encounters declined due to needing an appointment. Spoke with Paul Molina he is upset his amlodipine  is not being refilled and inquires if he can take his other blood pressure medications he was on prior to stent. Advised not to take other medication. Patient states he has difficulty coming in for an appointment due to not being able to get time off of work and also does not have a vehicle, he uses public transportation. Scheduled next available office visit with PCP on 12/14/23 patient is out of Amlodipine  and requesting enough to get through until his appointment on 12/14/23. He is worried because his pressure yesterday was 160/80, asymptomatic.  Discussed option of [facility] but he is able to get there this week. He will seek urgent care if he becomes symptomatic. Please follow up with patient RE: amlodipine  refill until OV. Please note he requests follow up response via MyChart due to unable to answer calls while at work.     Copied from CRM [id number]. Topic: Clinical - Medication Refill >> Nov 28, 2023  4:15 PM Felizardo Hotter wrote: Most Recent Primary Care Visit:  Provider: Winda Hastings W  Department: [facility]  Visit Type: OFFICE VISIT  Date: 12/27/2022  Medication: Amlodipine   Has the patient contacted their pharmacy? Yes (Agent: If no, request that the patient contact the pharmacy for the refill. If patient does not wish to contact the pharmacy document the reason why and proceed with request.) (Agent: If yes, when and what did the pharmacy advise?)  Is this the correct pharmacy for this prescription? Yes If no, delete pharmacy and type the correct one.  This is the patient's preferred pharmacy:  [facility] [address] [place] [address] Phone: [phone] Fax: [phone]  Has the prescription been filled recently? Yes  Is the patient out of  the medication? Yes  Has the patient been seen for an appointment in the last year OR does the patient have an upcoming appointment? Yes  Can we respond through MyChart? Yes  Agent: Please be advised that Rx refills may take up to 3 business days. We ask that you follow-up with your pharmacy.

## 2023-11-29 NOTE — Telephone Encounter (Signed)
 While I do sympathize with his situation it is very important for him to make his office visits with me every 3 months. He also has not seen cardiology after having a heart attack. In regard to his job, I can submit FMLA papers that allow him to have 1 day off every 3 months for office visit if he would like to bring the papers by the office. At this time I have allowed him a 60 day refill already and can not accommodate his request beyond that.

## 2023-11-30 ENCOUNTER — Other Ambulatory Visit: Payer: Self-pay

## 2023-11-30 ENCOUNTER — Other Ambulatory Visit: Payer: Self-pay | Admitting: Nurse Practitioner

## 2023-11-30 DIAGNOSIS — I10 Essential (primary) hypertension: Secondary | ICD-10-CM

## 2023-11-30 MED ORDER — METOPROLOL TARTRATE 25 MG PO TABS
25.0000 mg | ORAL_TABLET | Freq: Two times a day (BID) | ORAL | 0 refills | Status: DC
  Filled 2023-11-30 – 2023-12-08 (×2): qty 30, 15d supply, fill #0

## 2023-11-30 MED ORDER — AMLODIPINE BESYLATE 10 MG PO TABS
10.0000 mg | ORAL_TABLET | Freq: Every day | ORAL | 0 refills | Status: DC
  Filled 2023-11-30: qty 15, 15d supply, fill #0

## 2023-11-30 NOTE — Telephone Encounter (Signed)
15 day supply sent.  

## 2023-11-30 NOTE — Telephone Encounter (Signed)
 Please disregard. I've seen your response to the telephone encounter.

## 2023-11-30 NOTE — Telephone Encounter (Signed)
 Unable to reach patient by phone to relay results.  Voicemail left to return call to our office for response from provider.

## 2023-12-06 ENCOUNTER — Other Ambulatory Visit: Payer: Self-pay

## 2023-12-08 ENCOUNTER — Other Ambulatory Visit: Payer: Self-pay | Admitting: Nurse Practitioner

## 2023-12-08 ENCOUNTER — Other Ambulatory Visit (HOSPITAL_BASED_OUTPATIENT_CLINIC_OR_DEPARTMENT_OTHER): Payer: Self-pay

## 2023-12-08 DIAGNOSIS — I10 Essential (primary) hypertension: Secondary | ICD-10-CM

## 2023-12-10 ENCOUNTER — Other Ambulatory Visit: Payer: Self-pay

## 2023-12-10 ENCOUNTER — Other Ambulatory Visit: Payer: Self-pay | Admitting: Nurse Practitioner

## 2023-12-10 DIAGNOSIS — I10 Essential (primary) hypertension: Secondary | ICD-10-CM

## 2023-12-10 MED ORDER — AMLODIPINE BESYLATE 10 MG PO TABS
10.0000 mg | ORAL_TABLET | Freq: Every day | ORAL | 0 refills | Status: DC
Start: 2023-12-10 — End: 2024-02-18
  Filled 2023-12-10 – 2024-01-04 (×2): qty 30, 30d supply, fill #0

## 2023-12-10 MED ORDER — ATORVASTATIN CALCIUM 80 MG PO TABS
80.0000 mg | ORAL_TABLET | Freq: Every day | ORAL | 0 refills | Status: DC
  Filled 2023-12-10: qty 30, 30d supply, fill #0

## 2023-12-14 ENCOUNTER — Ambulatory Visit: Admitting: Nurse Practitioner

## 2023-12-17 DIAGNOSIS — Z419 Encounter for procedure for purposes other than remedying health state, unspecified: Secondary | ICD-10-CM | POA: Diagnosis not present

## 2024-01-02 ENCOUNTER — Ambulatory Visit: Admitting: Nurse Practitioner

## 2024-01-04 ENCOUNTER — Other Ambulatory Visit: Payer: Self-pay | Admitting: Nurse Practitioner

## 2024-01-04 ENCOUNTER — Other Ambulatory Visit: Payer: Self-pay

## 2024-01-04 DIAGNOSIS — I10 Essential (primary) hypertension: Secondary | ICD-10-CM

## 2024-01-07 ENCOUNTER — Ambulatory Visit: Admitting: Nurse Practitioner

## 2024-01-10 ENCOUNTER — Other Ambulatory Visit: Payer: Self-pay | Admitting: Nurse Practitioner

## 2024-01-10 ENCOUNTER — Other Ambulatory Visit: Payer: Self-pay | Admitting: Family Medicine

## 2024-01-10 DIAGNOSIS — I10 Essential (primary) hypertension: Secondary | ICD-10-CM

## 2024-01-13 ENCOUNTER — Emergency Department (HOSPITAL_COMMUNITY)

## 2024-01-13 ENCOUNTER — Other Ambulatory Visit: Payer: Self-pay

## 2024-01-13 ENCOUNTER — Encounter (HOSPITAL_COMMUNITY): Payer: Self-pay

## 2024-01-13 ENCOUNTER — Observation Stay (HOSPITAL_COMMUNITY): Admission: EM | Admit: 2024-01-13 | Discharge: 2024-01-14 | Disposition: A | Discharge status: Home / Self Care

## 2024-01-13 DIAGNOSIS — I4581 Long QT syndrome: Secondary | ICD-10-CM | POA: Insufficient documentation

## 2024-01-13 DIAGNOSIS — R17 Unspecified jaundice: Secondary | ICD-10-CM | POA: Diagnosis not present

## 2024-01-13 DIAGNOSIS — I2510 Atherosclerotic heart disease of native coronary artery without angina pectoris: Secondary | ICD-10-CM | POA: Diagnosis not present

## 2024-01-13 DIAGNOSIS — R7401 Elevation of levels of liver transaminase levels: Secondary | ICD-10-CM | POA: Insufficient documentation

## 2024-01-13 DIAGNOSIS — E876 Hypokalemia: Secondary | ICD-10-CM | POA: Diagnosis not present

## 2024-01-13 DIAGNOSIS — K1379 Other lesions of oral mucosa: Secondary | ICD-10-CM | POA: Diagnosis not present

## 2024-01-13 DIAGNOSIS — R221 Localized swelling, mass and lump, neck: Secondary | ICD-10-CM | POA: Diagnosis not present

## 2024-01-13 DIAGNOSIS — I10 Essential (primary) hypertension: Secondary | ICD-10-CM | POA: Insufficient documentation

## 2024-01-13 DIAGNOSIS — J029 Acute pharyngitis, unspecified: Secondary | ICD-10-CM | POA: Diagnosis not present

## 2024-01-13 DIAGNOSIS — R070 Pain in throat: Secondary | ICD-10-CM | POA: Diagnosis not present

## 2024-01-13 DIAGNOSIS — Z789 Other specified health status: Secondary | ICD-10-CM

## 2024-01-13 DIAGNOSIS — Z87891 Personal history of nicotine dependence: Secondary | ICD-10-CM | POA: Diagnosis not present

## 2024-01-13 DIAGNOSIS — J028 Acute pharyngitis due to other specified organisms: Secondary | ICD-10-CM

## 2024-01-13 DIAGNOSIS — J392 Other diseases of pharynx: Principal | ICD-10-CM

## 2024-01-13 DIAGNOSIS — R9431 Abnormal electrocardiogram [ECG] [EKG]: Secondary | ICD-10-CM | POA: Insufficient documentation

## 2024-01-13 DIAGNOSIS — I672 Cerebral atherosclerosis: Secondary | ICD-10-CM | POA: Diagnosis not present

## 2024-01-13 DIAGNOSIS — R059 Cough, unspecified: Secondary | ICD-10-CM | POA: Insufficient documentation

## 2024-01-13 DIAGNOSIS — R7989 Other specified abnormal findings of blood chemistry: Secondary | ICD-10-CM | POA: Insufficient documentation

## 2024-01-13 HISTORY — DX: Atherosclerotic heart disease of native coronary artery without angina pectoris: I25.10

## 2024-01-13 LAB — CBC WITH DIFFERENTIAL/PLATELET
Abs Immature Granulocytes: 0.03 10*3/uL (ref 0.00–0.07)
Basophils Absolute: 0 10*3/uL (ref 0.0–0.1)
Basophils Relative: 0 %
Eosinophils Absolute: 0 10*3/uL (ref 0.0–0.5)
Eosinophils Relative: 0 %
HCT: 39.2 % (ref 39.0–52.0)
Hemoglobin: 13.5 g/dL (ref 13.0–17.0)
Immature Granulocytes: 0 %
Lymphocytes Relative: 13 %
Lymphs Abs: 1.2 10*3/uL (ref 0.7–4.0)
MCH: 33.2 pg (ref 26.0–34.0)
MCHC: 34.4 g/dL (ref 30.0–36.0)
MCV: 96.3 fL (ref 80.0–100.0)
Monocytes Absolute: 0.8 10*3/uL (ref 0.1–1.0)
Monocytes Relative: 9 %
Neutro Abs: 7.1 10*3/uL (ref 1.7–7.7)
Neutrophils Relative %: 78 %
Platelets: 205 10*3/uL (ref 150–400)
RBC: 4.07 MIL/uL — ABNORMAL LOW (ref 4.22–5.81)
RDW: 13.3 % (ref 11.5–15.5)
WBC: 9.2 10*3/uL (ref 4.0–10.5)
nRBC: 0 % (ref 0.0–0.2)

## 2024-01-13 LAB — RESP PANEL BY RT-PCR (RSV, FLU A&B, COVID)  RVPGX2
Influenza A by PCR: NEGATIVE
Influenza B by PCR: NEGATIVE
Resp Syncytial Virus by PCR: NEGATIVE
SARS Coronavirus 2 by RT PCR: NEGATIVE

## 2024-01-13 LAB — MONONUCLEOSIS SCREEN: Mono Screen: NEGATIVE

## 2024-01-13 LAB — COMPREHENSIVE METABOLIC PANEL WITH GFR
ALT: 103 U/L — ABNORMAL HIGH (ref 0–44)
AST: 90 U/L — ABNORMAL HIGH (ref 15–41)
Albumin: 3.9 g/dL (ref 3.5–5.0)
Alkaline Phosphatase: 66 U/L (ref 38–126)
Anion gap: 11 (ref 5–15)
BUN: 6 mg/dL (ref 6–20)
CO2: 23 mmol/L (ref 22–32)
Calcium: 8.7 mg/dL — ABNORMAL LOW (ref 8.9–10.3)
Chloride: 104 mmol/L (ref 98–111)
Creatinine, Ser: 0.91 mg/dL (ref 0.61–1.24)
GFR, Estimated: >60 mL/min (ref >60)
Glucose, Bld: 104 mg/dL — ABNORMAL HIGH (ref 70–99)
Potassium: 3 mmol/L — ABNORMAL LOW (ref 3.5–5.1)
Sodium: 138 mmol/L (ref 135–145)
Total Bilirubin: 2.5 mg/dL — ABNORMAL HIGH (ref 0.0–1.2)
Total Protein: 7.7 g/dL (ref 6.5–8.1)

## 2024-01-13 LAB — HEPATITIS PANEL, ACUTE
HCV Ab: NONREACTIVE
Hep A IgM: NONREACTIVE
Hep B C IgM: NONREACTIVE
Hepatitis B Surface Ag: NONREACTIVE

## 2024-01-13 LAB — GROUP A STREP BY PCR: Group A Strep by PCR: NOT DETECTED

## 2024-01-13 LAB — BILIRUBIN, FRACTIONATED(TOT/DIR/INDIR)
Bilirubin, Direct: 0.3 mg/dL — ABNORMAL HIGH (ref 0.0–0.2)
Indirect Bilirubin: 1.3 mg/dL — ABNORMAL HIGH (ref 0.3–0.9)
Total Bilirubin: 1.6 mg/dL — ABNORMAL HIGH (ref 0.0–1.2)

## 2024-01-13 MED ORDER — AMLODIPINE BESYLATE 10 MG PO TABS
10.0000 mg | ORAL_TABLET | Freq: Every day | ORAL | Status: DC
  Administered 2024-01-13 – 2024-01-14 (×2): 10 mg via ORAL
  Filled 2024-01-13: qty 2
  Filled 2024-01-13: qty 1

## 2024-01-13 MED ORDER — POTASSIUM CHLORIDE 10 MEQ/100ML IV SOLN
10.0000 meq | Freq: Once | INTRAVENOUS | Status: AC
  Administered 2024-01-13: 10 meq via INTRAVENOUS
  Filled 2024-01-13: qty 100

## 2024-01-13 MED ORDER — CLINDAMYCIN PHOSPHATE 600 MG/50ML IV SOLN
600.0000 mg | Freq: Three times a day (TID) | INTRAVENOUS | Status: DC
  Administered 2024-01-13 – 2024-01-14 (×2): 600 mg via INTRAVENOUS
  Filled 2024-01-13 (×3): qty 50

## 2024-01-13 MED ORDER — DEXAMETHASONE SODIUM PHOSPHATE 10 MG/ML IJ SOLN
10.0000 mg | INTRAMUSCULAR | Status: DC
  Administered 2024-01-14: 10 mg via INTRAVENOUS
  Filled 2024-01-13: qty 1

## 2024-01-13 MED ORDER — METOPROLOL TARTRATE 25 MG PO TABS
25.0000 mg | ORAL_TABLET | Freq: Two times a day (BID) | ORAL | Status: DC
  Administered 2024-01-13 – 2024-01-14 (×3): 25 mg via ORAL
  Filled 2024-01-13 (×3): qty 1

## 2024-01-13 MED ORDER — DEXAMETHASONE SODIUM PHOSPHATE 10 MG/ML IJ SOLN
10.0000 mg | Freq: Once | INTRAMUSCULAR | Status: AC
  Administered 2024-01-13: 10 mg via INTRAVENOUS
  Filled 2024-01-13: qty 1

## 2024-01-13 MED ORDER — IOHEXOL 350 MG/ML SOLN
75.0000 mL | Freq: Once | INTRAVENOUS | Status: AC | PRN
  Administered 2024-01-13: 75 mL via INTRAVENOUS

## 2024-01-13 MED ORDER — ENOXAPARIN SODIUM 40 MG/0.4ML IJ SOSY
40.0000 mg | PREFILLED_SYRINGE | INTRAMUSCULAR | Status: DC
  Administered 2024-01-13: 40 mg via SUBCUTANEOUS
  Filled 2024-01-13: qty 0.4

## 2024-01-13 MED ORDER — ATORVASTATIN CALCIUM 80 MG PO TABS
80.0000 mg | ORAL_TABLET | Freq: Every day | ORAL | Status: DC
  Administered 2024-01-13 – 2024-01-14 (×2): 80 mg via ORAL
  Filled 2024-01-13: qty 2
  Filled 2024-01-13: qty 1

## 2024-01-13 MED ORDER — CLOPIDOGREL BISULFATE 75 MG PO TABS
75.0000 mg | ORAL_TABLET | Freq: Every day | ORAL | Status: DC
  Administered 2024-01-14: 75 mg via ORAL
  Filled 2024-01-13: qty 1

## 2024-01-13 MED ORDER — NICOTINE 7 MG/24HR TD PT24
7.0000 mg | MEDICATED_PATCH | Freq: Every day | TRANSDERMAL | Status: DC
  Filled 2024-01-13 (×2): qty 1

## 2024-01-13 MED ORDER — CLINDAMYCIN PHOSPHATE 600 MG/50ML IV SOLN
600.0000 mg | Freq: Once | INTRAVENOUS | Status: AC
  Administered 2024-01-13: 600 mg via INTRAVENOUS
  Filled 2024-01-13: qty 50

## 2024-01-13 MED ORDER — ASPIRIN 81 MG PO CHEW
81.0000 mg | CHEWABLE_TABLET | Freq: Every day | ORAL | Status: DC
  Administered 2024-01-13 – 2024-01-14 (×2): 81 mg via ORAL
  Filled 2024-01-13 (×2): qty 1

## 2024-01-13 MED ORDER — POTASSIUM CHLORIDE CRYS ER 20 MEQ PO TBCR
40.0000 meq | EXTENDED_RELEASE_TABLET | Freq: Once | ORAL | Status: AC
  Administered 2024-01-13: 40 meq via ORAL
  Filled 2024-01-13: qty 2

## 2024-01-13 MED ORDER — ACETAMINOPHEN 325 MG PO TABS: 650.0000 mg | ORAL_TABLET | Freq: Four times a day (QID) | ORAL | Status: DC | PRN

## 2024-01-13 MED ORDER — ACETAMINOPHEN 650 MG RE SUPP
650.0000 mg | Freq: Four times a day (QID) | RECTAL | Status: DC | PRN
Start: 2024-01-13 — End: 2024-01-14

## 2024-01-13 NOTE — Assessment & Plan Note (Addendum)
 AST 90 and ALT 103, possibly 2/2 mono (awaiting result).  Will continue to monitor. - Hepatic function panel - Hepatitis panel

## 2024-01-13 NOTE — Assessment & Plan Note (Addendum)
 CAD: LHC s/p stent in 06/2023.  On ASA and Plavix  daily, continue this admission. HLD: Continue home Lipitor  80 mg daily HTN: Continue home amlodipine  10 mg and metoprolol  tartrate 25 mg twice daily Smoking history: Stopped smoking recently, on nicotine  patches at home.  Patch ordered.

## 2024-01-13 NOTE — ED Triage Notes (Signed)
 Pt arrived from home via POV c/o sore, swollen throat. Pt states that is is not able to swallow water or his spit. Sore throat began on Friday. Airway patent at present.

## 2024-01-13 NOTE — Assessment & Plan Note (Addendum)
 K 3.0, repleted with IV K 10 meq in ED.  - Klor Con 40 meq once - AM BMP

## 2024-01-13 NOTE — Assessment & Plan Note (Addendum)
 Patient presented with sore throat and difficulty swallowing.  CT soft tissue neck with contrast demonstrated asymmetric R > L swelling of the pharynx and supraglottic larynx, and enlarged/edematous uvula, concerning for acute pharyngitis/laryngitis. Inflammation in the right parapharyngeal space and edema in the retropharyngeal space, but no abscess and bilateral paranasal sinus inflammation noted.  ENT consulted in the ED and recommended Decadron x 3 days and clindamycin.  Improvement noted s/p Decadron and clinda, patient able to tolerate fluids. - Admit to FMTS, attending Dr. Bevin Bucks - Med-Surg, Vital signs per floor - Heart diet  - VTE prophylaxis: Lovenox  - Continue antibiotics: Clindamycin per ENT - Decadron 10 mg x 3 per ENT - Monospot ordered - AM CBC/BMP/HFP - Monitor airway closely - Strep culture

## 2024-01-13 NOTE — Consult Note (Signed)
 ENT CONSULT:  Reason for Consult: Pharyngitis  Referring Physician:  ED and Medicine team  HPI: Paul Molina is an 48 y.o. male with h/o Anxiety, HTN, TMJ and CAD with h/o smoking who ENT was consulted for pharyngitis. Notes that he has had a sore throat since Friday, with positive sick contact (son with Mono?). Sore throat became worse, with dysphagia and occasional cough so went to ED where he was diagnosed with pharyngitis and admitted. ENT was consulted for assistance with management. He received IV decadron and antibiotics with significant improvement - sore throat is significantly better, no dysphagia, no trouble with breathing, lays flat without issue, and otherwise denies fevers.  Labs and CT reviewed as below  Past Medical History:  Diagnosis Date   Anxiety    CAD (coronary artery disease)    NSTEMI 2024 s/p stent   Hypertension    Hypertensive urgency 10/01/2019   TMJ (temporomandibular joint syndrome) 10/13/2021    Past Surgical History:  Procedure Laterality Date   CORONARY STENT INTERVENTION N/A 07/02/2023   Procedure: CORONARY STENT INTERVENTION;  Surgeon: Odie Benne, MD;  Location: [facility];  Service: Cardiovascular;  Laterality: N/A;   LEFT HEART CATH AND CORONARY ANGIOGRAPHY N/A 07/02/2023   Procedure: LEFT HEART CATH AND CORONARY ANGIOGRAPHY;  Surgeon: Odie Benne, MD;  Location: [facility];  Service: Cardiovascular;  Laterality: N/A;   NO PAST SURGERIES      Family History  Problem Relation Age of Onset   Hypertension Mother    Hypertension Father    Stroke Father     Social History:  reports that he has been smoking cigarettes. He has a 7 pack-year smoking history. He has never used smokeless tobacco. He reports current alcohol use. He reports that he does not currently use drugs.  Allergies: No Known Allergies  Medications: I have reviewed the patient's current medications.  Results for orders placed or  performed during the hospital encounter of 01/13/24 (from the past 48 hours)  Resp panel by RT-PCR (RSV, Flu A&B, Covid) Throat     Status: None   Collection Time: 01/13/24  5:29 AM   Specimen: Throat; Nasal Swab  Result Value Ref Range   SARS Coronavirus 2 by RT PCR NEGATIVE NEGATIVE   Influenza A by PCR NEGATIVE NEGATIVE   Influenza B by PCR NEGATIVE NEGATIVE    Comment: (NOTE) The Xpert Xpress SARS-CoV-2/FLU/RSV plus assay is intended as an aid in the diagnosis of influenza from Nasopharyngeal swab specimens and should not be used as a sole basis for treatment. Nasal washings and aspirates are unacceptable for Xpert Xpress SARS-CoV-2/FLU/RSV testing.  Fact Sheet for Patients: BloggerCourse.com  Fact Sheet for Healthcare Providers: SeriousBroker.it  This test is not yet approved or cleared by the [place]  FDA and has been authorized for detection and/or diagnosis of SARS-CoV-2 by FDA under an Emergency Use Authorization (EUA). This EUA will remain in effect (meaning this test can be used) for the duration of the COVID-19 declaration under Section 564(b)(1) of the Act, 21 U.S.C. section 360bbb-3(b)(1), unless the authorization is terminated or revoked.     Resp Syncytial Virus by PCR NEGATIVE NEGATIVE    Comment: (NOTE) Fact Sheet for Patients: BloggerCourse.com  Fact Sheet for Healthcare Providers: SeriousBroker.it  This test is not yet approved or cleared by the [place]  FDA and has been authorized for detection and/or diagnosis of SARS-CoV-2 by FDA under an Emergency Use Authorization (EUA). This EUA will remain in effect (  meaning this test can be used) for the duration of the COVID-19 declaration under Section 564(b)(1) of the Act, 21 U.S.C. section 360bbb-3(b)(1), unless the authorization is terminated or revoked.  Performed at [facility], [address], [place], [place] [address]   CBC with Differential     Status: Abnormal   Collection Time: 01/13/24  5:34 AM  Result Value Ref Range   WBC 9.2 4.0 - 10.5 K/uL   RBC 4.07 (L) 4.22 - 5.81 MIL/uL   Hemoglobin 13.5 13.0 - 17.0 g/dL   HCT 60.4 54.0 - 98.1 %   MCV 96.3 80.0 - 100.0 fL   MCH 33.2 26.0 - 34.0 pg   MCHC 34.4 30.0 - 36.0 g/dL   RDW 19.1 47.8 - 29.5 %   Platelets 205 150 - 400 K/uL   nRBC 0.0 0.0 - 0.2 %   Neutrophils Relative % 78 %   Neutro Abs 7.1 1.7 - 7.7 K/uL   Lymphocytes Relative 13 %   Lymphs Abs 1.2 0.7 - 4.0 K/uL   Monocytes Relative 9 %   Monocytes Absolute 0.8 0.1 - 1.0 K/uL   Eosinophils Relative 0 %   Eosinophils Absolute 0.0 0.0 - 0.5 K/uL   Basophils Relative 0 %   Basophils Absolute 0.0 0.0 - 0.1 K/uL   Immature Granulocytes 0 %   Abs Immature Granulocytes 0.03 0.00 - 0.07 K/uL    Comment: Performed at [facility], [address], [place], [place] [address]  Comprehensive metabolic panel     Status: Abnormal   Collection Time: 01/13/24  5:34 AM  Result Value Ref Range   Sodium 138 135 - 145 mmol/L   Potassium 3.0 (L) 3.5 - 5.1 mmol/L   Chloride 104 98 - 111 mmol/L   CO2 23 22 - 32 mmol/L   Glucose, Bld 104 (H) 70 - 99 mg/dL    Comment: Glucose reference range applies only to samples taken after fasting for at least 8 hours.   BUN 6 6 - 20 mg/dL   Creatinine, Ser 8.65 0.61 - 1.24 mg/dL   Calcium  8.7 (L) 8.9 - 10.3 mg/dL   Total Protein 7.7 6.5 - 8.1 g/dL   Albumin 3.9 3.5 - 5.0 g/dL   AST 90 (H) 15 - 41 U/L   ALT 103 (H) 0 - 44 U/L   Alkaline Phosphatase 66 38 - 126 U/L   Total Bilirubin 2.5 (H) 0.0 - 1.2 mg/dL   GFR, Estimated >78 >46 mL/min    Comment: (NOTE) Calculated using the CKD-EPI Creatinine Equation (2021)    Anion gap 11 5 - 15    Comment: Performed at [facility], [address] [place], [place] [address]  Group A Strep by PCR     Status: None   Collection Time: 01/13/24  5:46 AM   Specimen: Throat; Sterile  Swab  Result Value Ref Range   Group A Strep by PCR NOT DETECTED NOT DETECTED    Comment: Performed at [facility], [address], [place], [place] [address]  Mononucleosis screen     Status: None   Collection Time: 01/13/24  3:30 PM  Result Value Ref Range   Mono Screen NEGATIVE NEGATIVE    Comment: Performed at [facility], [address] [place], [place] [address]  Hepatitis panel, acute     Status: None   Collection Time: 01/13/24  3:30 PM  Result Value Ref Range   Hepatitis  B Surface Ag NON REACTIVE NON REACTIVE   HCV Ab NON REACTIVE NON REACTIVE    Comment: (NOTE) Nonreactive HCV antibody screen is consistent with no HCV infections,  unless recent infection is suspected or other evidence exists to indicate HCV infection.     Hep A IgM NON REACTIVE NON REACTIVE   Hep B C IgM NON REACTIVE NON REACTIVE    Comment: Performed at [facility], [address], [place], [place] [address]  Bilirubin, fractionated(tot/dir/indir)     Status: Abnormal   Collection Time: 01/13/24  5:59 PM  Result Value Ref Range   Total Bilirubin 1.6 (H) 0.0 - 1.2 mg/dL   Bilirubin, Direct 0.3 (H) 0.0 - 0.2 mg/dL   Indirect Bilirubin 1.3 (H) 0.3 - 0.9 mg/dL    Comment: Performed at [facility], [address] [place], [place] [address]  Basic metabolic panel with GFR     Status: Abnormal   Collection Time: 01/14/24  6:02 AM  Result Value Ref Range   Sodium 135 135 - 145 mmol/L   Potassium 3.6 3.5 - 5.1 mmol/L   Chloride 105 98 - 111 mmol/L   CO2 21 (L) 22 - 32 mmol/L   Glucose, Bld 157 (H) 70 - 99 mg/dL    Comment: Glucose reference range applies only to samples taken after fasting for at least 8 hours.   BUN 11 6 - 20 mg/dL   Creatinine, Ser 8.41 0.61 - 1.24 mg/dL   Calcium  8.6 (L) 8.9 - 10.3 mg/dL   GFR, Estimated >32 >44 mL/min    Comment: (NOTE) Calculated using the CKD-EPI Creatinine Equation (2021)    Anion gap 9 5 - 15    Comment: Performed at [facility], [address], [place], [place] [address]  Hepatic function panel     Status: Abnormal   Collection Time: 01/14/24  6:02 AM  Result Value Ref Range   Total Protein 7.4 6.5 - 8.1 g/dL   Albumin 3.4 (L) 3.5 - 5.0 g/dL   AST 45 (H) 15 - 41 U/L   ALT 65 (H) 0 - 44 U/L   Alkaline Phosphatase 61 38 - 126 U/L   Total Bilirubin 0.9 0.0 - 1.2 mg/dL   Bilirubin, Direct 0.2 0.0 - 0.2 mg/dL   Indirect Bilirubin 0.7 0.3 - 0.9 mg/dL    Comment: Performed at [facility], [address] [place], [place] [address]  Protime-INR     Status: None   Collection Time: 01/14/24  6:02 AM  Result Value Ref Range   Prothrombin Time 14.1 11.4 - 15.2 seconds   INR 1.1 0.8 - 1.2    Comment: (NOTE) INR goal varies based on device and disease states. Performed at [facility], [address], [place], [place] [address]     CT Soft Tissue Neck W Contrast Result Date: 01/13/2024 CLINICAL DATA:  48 year old male with throat swelling and pain, unable to swallow secretions. Reportedly symptoms began 2 days ago. EXAM: CT NECK WITH CONTRAST TECHNIQUE: Multidetector CT imaging of the neck was performed using the standard protocol following the bolus administration of intravenous contrast. RADIATION DOSE REDUCTION: This exam was performed according to the departmental dose-optimization program which includes automated exposure control, adjustment of the mA and/or kV according to patient size and/or use of iterative reconstruction technique. CONTRAST:  75mL OMNIPAQUE  IOHEXOL  350 MG/ML SOLN COMPARISON:  None Available. FINDINGS: Pharynx and larynx: Abnormal larynx and pharynx. The epiglottis  remains normal (sagittal image 49) but there is moderate asymmetric right side supraglottic soft tissue swelling (series 4, image 67) which is mostly low-density. The true vocal cord level appears normal. The right para form sinus is effaced. The right AE fold is enlarged. Similar but less pronounced asymmetric right  greater than left pharyngeal wall edema and thickening such as on series 4, image 46. Negative lingual tonsil. Mildly heterogeneous enhancement of the palatine tonsils. Uvula also appears low-density and enlarged (sagittal image 49). There is inflammation in the right parapharyngeal space. No discrete tonsillar suppurative changes. The left parapharyngeal space remains normal. There is mild retropharyngeal space edema. Salivary glands: Sublingual spaces, submandibular glands, parotid glands appear symmetric and within normal limits. However, there is soft tissue edema tracking from the right parapharyngeal space into the posterior right submandibular space on series 4, image 63. But no convincing inflammation or abnormality of the adjacent right submandibular gland. No sialolithiasis identified. Thyroid: Within normal limits. Lymph nodes: Subcentimeter bilateral cervical lymph nodes, but asymmetric mildly larger and mildly hyperenhancing on the right side as seen on series 4, images 42 and 51. No cystic or necrotic nodes. Other nodal stations relatively spared. Vascular: Major vascular structures in the bilateral neck and at the skull base are enhancing and remain patent including the right IJ. No atherosclerosis in the neck. Limited intracranial: Mild Calcified atherosclerosis at the skull base. Negative visible brain parenchyma. Visualized orbits: Negative. Mastoids and visualized paranasal sinuses: Mild to moderate scattered bilateral paranasal sinus mucosal thickening sparing the sphenoid sinuses. Tympanic cavities and mastoids are clear. Skeleton: No acute dental finding identified. Ordinary lower cervical spine disc and endplate degeneration which is predominantly anterior. No acute osseous abnormality identified. Upper chest: Visible upper lungs are clear. Normal visible superior mediastinum, subglottic trachea. IMPRESSION: 1. Asymmetric right > left soft tissue swelling of the pharynx and the supraglottic  larynx. Epiglottis is spared, remains normal. But the uvula also appears somewhat edematous and enlarged. Main differential considerations are Acute Infectious Pharyngitis/Laryngitis (favored, see #2) and Angioedema. 2. Inflammation in the right parapharyngeal space tracking into the right submandibular space, and mild edema in the retropharyngeal space. Small but reactive appearing right cervical lymph nodes. But NO discrete suppuration or abscess. And no other complicating features identified. 3. Bilateral paranasal sinus inflammation. 4. Negative visible upper chest. Electronically Signed   By: Marlise Simpers M.D.   On: 01/13/2024 07:21    ROS: as above  Blood pressure (!) 147/94, pulse 80, temperature 98 F (36.7 C), resp. rate 17, height 6\' 1"  (1.854 m), weight 71.2 kg, SpO2 96%.  PHYSICAL EXAM: CONSTITUTIONAL: well developed, no distress and alert and oriented x 3 CARDIOVASCULAR: normal rate PULMONARY/CHEST WALL: effort normal and no stridor, no stertor, no dysphonia; tolerates secretions, easily lays flat HENT: Head : normocephalic and atraumatic Ears: Right ear:   canal normal, external ear normal Left ear:   canal normal, external ear normal Nose: nose normal and no purulence Mouth/Throat:  Mouth: uvula midline and no oral lesions Throat: oropharynx clear and moist - unable to appreciate any asymmetry, slight posterior OP erythema. EYES: conjunctiva normal, EOM normal and PERRL NECK: supple, trachea normal and no thyromegaly or palpable cervical LAD  Studies Reviewed: CT Neck (01/13/2024) independently interpreted - noted right > left pharyngeal and supraglottic edema with mild uvular edema; some right PP space and right SMG space edema; no abscess noted. Labs 01/13/2024:  GAS - negative; Resp panel: negative CBC WBC 9.2  Assessment/Plan: 48 y.o. with  history of smoking now with: Pharyngitis - Suspect viral pharyngitis. He has improved quickly and significantly on decadron and abx so we  deferred TFL.  - Given improvement, if continues to do well after dex 10mg  x3doses, and iv abx (Clinda), reasonable to discharge on 01/14/2024 on PO clindamycin x10d and medrol dose pack - Return precautions discussed  Evelina Hippo   01/14/2024, 9:07 AM   MDM:  Adelene Adolf Complexity/Problems addressed: mod Data complexity: mod - independent CT interpretation - Morbidity: mod - Rx recommendation Time spent on patient encounter, chart review: 55 mins

## 2024-01-13 NOTE — Assessment & Plan Note (Addendum)
 QTc mildly elevated to 475 in past Repeat EKG today

## 2024-01-13 NOTE — ED Provider Notes (Signed)
 [place] EMERGENCY DEPARTMENT AT [facility] Provider Note   CSN: [id number] Arrival date & time: 01/13/24  0444     History  Chief Complaint  Patient presents with   Sore Throat    Paul Molina is a 48 y.o. male.  48 year old male presenting with sore throat.  Symptoms have been ongoing since Friday, this morning patient has been unable to swallow pills/water or eat secondary to pain.  Patient reports that his son had similar symptoms last week, it is unclear if his son was diagnosed with strep or viral illness.  He reports occasional cough but otherwise denies fevers, swelling of lips/tongue/throat, abdominal pain/nausea/vomiting/diarrhea, chest pain/shortness of breath.   Sore Throat       Home Medications Prior to Admission medications   Medication Sig Start Date End Date Taking? Authorizing Provider  amLODipine  (NORVASC ) 10 MG tablet Take 1 tablet (10 mg total) by mouth daily for 15 days. 12/10/23 02/03/24  Newlin, Enobong, MD  aspirin  81 MG chewable tablet Chew 1 tablet (81 mg total) by mouth daily. 10/04/23   Fleming, Zelda W, NP  atorvastatin  (LIPITOR ) 80 MG tablet Take 1 tablet (80 mg total) by mouth daily. 12/10/23   Newlin, Enobong, MD  clopidogrel  (PLAVIX ) 75 MG tablet Take 1 tablet (75 mg total) by mouth daily with breakfast. 10/04/23   Fleming, Zelda W, NP  metoprolol  tartrate (LOPRESSOR ) 25 MG tablet Take 1 tablet (25 mg total) by mouth 2 (two) times daily. 11/30/23   Fleming, Zelda W, NP      Allergies    Patient has no known allergies.    Review of Systems   Review of Systems  Physical Exam Updated Vital Signs BP (!) 150/99 (BP Location: Right Arm)   Pulse 99   Temp 99.3 F (37.4 C)   Resp 18   Ht 6\' 1"  (1.854 m)   Wt 71.2 kg   SpO2 97%   BMI 20.71 kg/m  Physical Exam Vitals and nursing note reviewed.  HENT:     Head: Normocephalic.     Mouth/Throat:     Mouth: Mucous membranes are moist.     Pharynx: Pharyngeal swelling (R  parapharyngeal space) and uvula swelling present.     Tonsils: No tonsillar exudate. 3+ on the right.     Comments: Uvula appears midline but swollen with right sided parapharyngeal/tonsillar swelling somewhat obscuring view Neck:     Comments: No palpable cervical lymphadenopathy but right sided tenderness to palpation noted in submandibular area Cardiovascular:     Rate and Rhythm: Normal rate and regular rhythm.  Pulmonary:     Effort: Pulmonary effort is normal. No respiratory distress.     Breath sounds: Normal breath sounds.  Musculoskeletal:     Cervical back: Normal range of motion.  Skin:    General: Skin is warm and dry.  Neurological:     Mental Status: He is alert.     ED Results / Procedures / Treatments   Labs (all labs ordered are listed, but only abnormal results are displayed) Labs Reviewed  CBC WITH DIFFERENTIAL/PLATELET - Abnormal; Notable for the following components:      Result Value   RBC 4.07 (*)    All other components within normal limits  COMPREHENSIVE METABOLIC PANEL WITH GFR - Abnormal; Notable for the following components:   Potassium 3.0 (*)    Glucose, Bld 104 (*)    Calcium  8.7 (*)    AST 90 (*)    ALT 103 (*)  Total Bilirubin 2.5 (*)    All other components within normal limits  GROUP A STREP BY PCR  RESP PANEL BY RT-PCR (RSV, FLU A&B, COVID)  [id number]    EKG None  Radiology CT Soft Tissue Neck W Contrast Result Date: 01/13/2024 CLINICAL DATA:  48 year old male with throat swelling and pain, unable to swallow secretions. Reportedly symptoms began 2 days ago. EXAM: CT NECK WITH CONTRAST TECHNIQUE: Multidetector CT imaging of the neck was performed using the standard protocol following the bolus administration of intravenous contrast. RADIATION DOSE REDUCTION: This exam was performed according to the departmental dose-optimization program which includes automated exposure control, adjustment of the mA and/or kV according to patient size  and/or use of iterative reconstruction technique. CONTRAST:  75mL OMNIPAQUE  IOHEXOL  350 MG/ML SOLN COMPARISON:  None Available. FINDINGS: Pharynx and larynx: Abnormal larynx and pharynx. The epiglottis remains normal (sagittal image 49) but there is moderate asymmetric right side supraglottic soft tissue swelling (series 4, image 67) which is mostly low-density. The true vocal cord level appears normal. The right para form sinus is effaced. The right AE fold is enlarged. Similar but less pronounced asymmetric right greater than left pharyngeal wall edema and thickening such as on series 4, image 46. Negative lingual tonsil. Mildly heterogeneous enhancement of the palatine tonsils. Uvula also appears low-density and enlarged (sagittal image 49). There is inflammation in the right parapharyngeal space. No discrete tonsillar suppurative changes. The left parapharyngeal space remains normal. There is mild retropharyngeal space edema. Salivary glands: Sublingual spaces, submandibular glands, parotid glands appear symmetric and within normal limits. However, there is soft tissue edema tracking from the right parapharyngeal space into the posterior right submandibular space on series 4, image 63. But no convincing inflammation or abnormality of the adjacent right submandibular gland. No sialolithiasis identified. Thyroid: Within normal limits. Lymph nodes: Subcentimeter bilateral cervical lymph nodes, but asymmetric mildly larger and mildly hyperenhancing on the right side as seen on series 4, images 42 and 51. No cystic or necrotic nodes. Other nodal stations relatively spared. Vascular: Major vascular structures in the bilateral neck and at the skull base are enhancing and remain patent including the right IJ. No atherosclerosis in the neck. Limited intracranial: Mild Calcified atherosclerosis at the skull base. Negative visible brain parenchyma. Visualized orbits: Negative. Mastoids and visualized paranasal sinuses:  Mild to moderate scattered bilateral paranasal sinus mucosal thickening sparing the sphenoid sinuses. Tympanic cavities and mastoids are clear. Skeleton: No acute dental finding identified. Ordinary lower cervical spine disc and endplate degeneration which is predominantly anterior. No acute osseous abnormality identified. Upper chest: Visible upper lungs are clear. Normal visible superior mediastinum, subglottic trachea. IMPRESSION: 1. Asymmetric right > left soft tissue swelling of the pharynx and the supraglottic larynx. Epiglottis is spared, remains normal. But the uvula also appears somewhat edematous and enlarged. Main differential considerations are Acute Infectious Pharyngitis/Laryngitis (favored, see #2) and Angioedema. 2. Inflammation in the right parapharyngeal space tracking into the right submandibular space, and mild edema in the retropharyngeal space. Small but reactive appearing right cervical lymph nodes. But NO discrete suppuration or abscess. And no other complicating features identified. 3. Bilateral paranasal sinus inflammation. 4. Negative visible upper chest. Electronically Signed   By: Marlise Simpers M.D.   On: 01/13/2024 07:21    Procedures Procedures    Medications Ordered in ED Medications  dexamethasone (DECADRON) injection 10 mg (has no administration in time range)  clindamycin (CLEOCIN) IVPB 600 mg (has no administration in time range)  potassium chloride   10 mEq in 100 mL IVPB (has no administration in time range)  iohexol  (OMNIPAQUE ) 350 MG/ML injection 75 mL (75 mLs Intravenous Contrast Given 01/13/24 0707)    ED Course/ Medical Decision Making/ A&P Clinical Course as of 01/13/24 0836  Sun Jan 13, 2024  0828 Evaluated.  Maintaining airway and secretions.  CT scan with what appears to be supraglottitis type picture.  Decadron antibiotics.  Plan to admit for observation. [TY]    Clinical Course User Index [TY] Rolinda Climes, DO                                 Medical  Decision Making This patient presents to the ED for concern of sore throat, this involves an extensive number of treatment options, and is a complaint that carries with it a high risk of complications and morbidity.  The differential diagnosis includes strep pharyngitis, other viral pharyngitis, COVID/Flu/RSV, retropharyngeal abscess, Ludwig's angina, angioedema.    Co morbidities that complicate the patient evaluation  HTN, HLD   Additional history obtained:  Additional history obtained from record review External records from outside source obtained and reviewed including previous ED note   Lab Tests:  I Ordered, and personally interpreted labs.  The pertinent results include: CBC unremarkable.  CMP notable for hypokalemia, elevated AST/ALT/bilirubin, I have no baseline for comparison.  Respiratory viral panel negative, group A strep negative.   Imaging Studies ordered:  I ordered imaging studies including CT soft tissue neck with contrast  I independently visualized and interpreted imaging which showed  1. Asymmetric right > left soft tissue swelling of the pharynx and the supraglottic larynx. Epiglottis is spared, remains normal. But the uvula also appears somewhat edematous and enlarged. Main differential considerations are Acute InfectiousPharyngitis/Laryngitis (favored, see #2) and Angioedema. 2. Inflammation in the right parapharyngeal space tracking into the right submandibular space, and mild edema in the retropharyngeal space. Small but reactive appearing right cervical lymph nodes. But NO discrete suppuration or abscess. And no other complicating features identified. 3. Bilateral paranasal sinus inflammation. 4. Negative visible upper chest.  I agree with the radiologist interpretation   Cardiac Monitoring: / EKG:  The patient was maintained on a cardiac monitor.  I personally viewed and interpreted the cardiac monitored which showed an underlying rhythm of: normal sinus  rhythm   Consultations Obtained:  I requested consultation with the ENT and hospitalist,  and discussed lab and imaging findings as well as pertinent plan - they recommend:   - ENT: Spoke with Dr. Lydia Sams, he reviewed patient's imaging and agrees that Decadron/clindamycin is appropriate for management, he does feel that admission for observation is reasonable plan given degree of swelling. Recommends 10mg  Decadron x 3.  - Hospitalist: Spoke with Dr. Randeen Busman who recommends admission for observation, recommend p.o. challenge to assess for symptomatic improvement following Decadron.   Problem List / ED Course / Critical interventions / Medication management  I ordered medication including Decadron and clindamycin for parapharyngeal/tonsillar/uvular swelling, IV potassium for hypokalemia Reevaluation of the patient after these medicines showed that the patient improved I have reviewed the patients home medicines and have made adjustments as needed   Social Determinants of Health:  Tobacco use    Test / Admission - Considered:  See above for physical exam, lab, imaging findings.  Patient is not exhibiting signs of respiratory distress, airway is patent, patient is handling secretions well and vitals are reassuring. Given extensive  nature of parapharyngeal swelling, I did administer Decadron and clindamycin for swelling/suspected infection, consulted ENT, see above for their recommendations.  Spoke with hospitalist service, see above for their recommendations.  Plan is to admit patient for observation to assess for symptomatic improvement since initiation of Decadron/clindamycin.    Risk Prescription drug management. Decision regarding hospitalization.           Final Clinical Impression(s) / ED Diagnoses Final diagnoses:  Pharyngeal swelling  Uvular swelling  Elevated transaminase level  Hypokalemia  Serum total bilirubin elevated    Rx / DC Orders ED Discharge Orders      None         Kendrick Pax, PA-C 01/13/24 1020    Rolinda Climes, DO 01/13/24 1100

## 2024-01-13 NOTE — Hospital Course (Addendum)
 Nicolo Tomko is a 48 y.o.male with a history of HTN, CAD, HLD, Anxiety, TMJ who was admitted to the [facility] Medicine Teaching Service at [facility] for parapharyngeal and uvular swelling. His hospital course is detailed below:  Acute Pharyngitis Patient presented with sore throat, dysphagia, and tonsillar/uvular swelling.  Clear airway with no respiratory distress.  CT soft tissue neck demonstrated asymmetric R>L swelling of the pharynx and supraglottic larynx, edematous/enlarged uvula, and inflammation in the R parapharyngeal space and mild edema in the retropharyngeal space without abscess formation.  ENT was consulted in the ED and recommended Clindamycin and Decadron inpatient.  Advised to continue upon discharge with Clindamycin 300 mg TID to complete 10 day course on 6/17 and Medrol Dose Pack.  Dysphagia and pain improved prior to discharge.  Return precautions were advised.   Hypokalemia K 3.0 on admission, repleted with IV and PO K with improvement.   Other chronic conditions were medically managed with home medications and formulary alternatives as necessary (HTN, CAD, HLD, Smoking history)  PCP Follow-up Recommendations: Ensure completion of antibiotic and steroid course.

## 2024-01-13 NOTE — Plan of Care (Signed)
 FMTS Interim Progress Note  S: Patient resting comfortably.  Denies symptoms of dysphagia.  Reports that his swelling has improved  O: BP (!) 144/94 (BP Location: Left Arm)   Pulse 89   Temp 98.4 F (36.9 C) (Oral)   Resp 18   Ht 6\' 1"  (1.854 m)   Wt 71.2 kg   SpO2 98%   BMI 20.71 kg/m   General: well-appearing, no acute distress Cardio: RRR, no murmur on exam Pulm: clear, No increased work of breathing Abdomen: Soft, bowel sounds present, nontender Extremity: No peripheral edema   A/P: Acute pharyngitis: Stable.  No increased work of breathing.  No dysphagia on exam. - Continue clindamycin - Continue Decadron - Await ENT recs  Elevated LFTs: Fractionated bilirubin showing indirect shift.  No reported abdominal pain on exam.  Could consider right upper quadrant ultrasound tomorrow. - Will add on INR to morning blood work  Clem Currier, DO 01/13/2024, 8:31 PM PGY-2, [facility] Family Medicine Service pager [phone]

## 2024-01-13 NOTE — Assessment & Plan Note (Addendum)
 Bili 2.5, possible 2/2 mononucleosis (awaiting result).  Will continue to monitor. - Fractionated bilirubin per lab - HFP in AM

## 2024-01-13 NOTE — ED Provider Triage Note (Signed)
 Emergency Medicine Provider Triage Evaluation Note  Paul Molina , a 48 y.o. male  was evaluated in triage.  Pt complains of sore throat, inability to swallow his own secretions after being ill for a few days, change in phonation..  Review of Systems  Positive: As above Negative: Fevers, SOB  Physical Exam  BP (!) 150/99 (BP Location: Right Arm)   Pulse 99   Temp 99.3 F (37.4 C)   Resp 18   Ht 6\' 1"  (1.854 m)   Wt 71.2 kg   SpO2 97%   BMI 20.71 kg/m  Gen:   Awake, no distress   Resp:  Normal effort  MSK:   Moves extremities without difficulty  Other:  Uvular edema, pharyngeal erythema, cervical LAD, no sublingual or submental TTP.   Medical Decision Making  Medically screening exam initiated at 5:19 AM.  Appropriate orders placed.  Paul Molina was informed that the remainder of the evaluation will be completed by another provider, this initial triage assessment does not replace that evaluation, and the importance of remaining in the ED until their evaluation is complete.  This chart was dictated using voice recognition software, Dragon. Despite the best efforts of this provider to proofread and correct errors, errors may still occur which can change documentation meaning.    Kae Oram, PA-C 01/13/24 [phone]

## 2024-01-13 NOTE — H&P (Addendum)
 Hospital Admission History and Physical Service Pager: [phone]  Patient name: Paul Molina Medical record number: [id number] Date of Birth: July 06, 1976 Age: 48 y.o. Gender: male  Primary Care Provider: Collins Dean, NP Consultants: ENT Code Status: Full Code Preferred Emergency Contact:  Contact Information     Name Relation Home Work Mobile   Emma Significant other [phone]      Chief Complaint: sore throat x2 days and difficulty swallowing  Assessment and Plan: Paul Molina is a 48 y.o. male with PMHx of CAD, HTN, and HLD presenting with tonsillar and uvular swelling w/ dysphagia. Differential for this patient's presentation of this includes: Acute Pharyngitis: Most likely with CT findings, tonsillar swelling and difficulty swallowing. CT soft tissue neck revealed asymmetric swelling R>L and enlarged/edematous uvula, no abscess. EBV Infection, Mononucleosis: Possible due to exposure from his son, will check Monospot Parapharyngeal abscess: Ruled out with CT soft tissue neck. Assessment & Plan Acute pharyngitis Patient presented with sore throat and difficulty swallowing.  CT soft tissue neck with contrast demonstrated asymmetric R > L swelling of the pharynx and supraglottic larynx, and enlarged/edematous uvula, concerning for acute pharyngitis/laryngitis. Inflammation in the right parapharyngeal space and edema in the retropharyngeal space, but no abscess and bilateral paranasal sinus inflammation noted.  ENT consulted in the ED and recommended Decadron x 3 days and clindamycin.  Improvement noted s/p Decadron and clinda, patient able to tolerate fluids. - Admit to FMTS, attending Dr. Bevin Molina - Med-Surg, Vital signs per floor - Heart diet  - VTE prophylaxis: Lovenox  - Continue antibiotics: Clindamycin per ENT - Decadron 10 mg x 3 per ENT - Monospot ordered - AM CBC/BMP/HFP - Monitor airway closely - Strep culture Prolonged QT interval QTc  mildly elevated to 475 in past Repeat EKG today  Elevated LFTs AST 90 and ALT 103, possibly 2/2 mono (awaiting result).  Will continue to monitor. - Hepatic function panel - Hepatitis panel Elevated bilirubin Bili 2.5, possible 2/2 mononucleosis (awaiting result).  Will continue to monitor. - Fractionated bilirubin per lab - HFP in AM Hypokalemia K 3.0, repleted with IV K 10 meq in ED.  - Klor Con 40 meq once - AM BMP Chronic health problem CAD: LHC s/p stent in 06/2023.  On ASA and Plavix  daily, continue this admission. HLD: Continue home Lipitor  80 mg daily HTN: Continue home amlodipine  10 mg and metoprolol  tartrate 25 mg twice daily Smoking history: Stopped smoking recently, on nicotine  patches at home.  Patch ordered.  FEN/GI: Heart diet VTE Prophylaxis: Lovenox   Disposition: Med-Surg  History of Present Illness:  Paul Molina is a 48 y.o. male presenting with sore throat since Friday.  He thinks his son may have had mono.  This morning he reports he has been unable to swallow pills/water or eat secondary to pain.  Has an occasional cough but denies fevers, chills, chest pain, shortness of breath, swelling of the lips/tongue/throat, abdominal pain, nausea, vomiting, and diarrhea.  In the ED, patient was hypertensive but otherwise saturating well on room air.  Viral panel and strep were negative.  CBC WNL.  CMP with hypokalemia (3.0) which was repleted, elevated LFTs (AST 90, ALT 103) and elevated bilirubin to 2.5.  CT neck with contrast demonstrated asymmetric R>L soft tissue swelling of the pharynx and supraglottic larynx, and uvula is edematous and enlarged.  Inflammation noted in the right parapharyngeal space tracking into the right submandibular space, and mild edema  in the retropharyngeal space without abscess formation.  Review Of Systems: Per HPI  Pertinent Past Medical History: Anxiety HTN TMJ Remainder reviewed in history tab.   Pertinent Past Surgical  History: LHC and stent (06/2023)  Remainder reviewed in history tab.   Pertinent Social History: Tobacco use: Former; 3-4 sticks per day x 24 years but recently quit currently on patch 30 Alcohol use: Socially, 4-5 beers per week Other Substance use: None Lives with 2 sons  Pertinent Family History: Mother: Deceased; HTN Father: Deceased; HTN, stroke  Remainder reviewed in history tab.   Important Outpatient Medications: Amlodipine  10 mg for 15 days? Aspirin  81 mg daily Lipitor  80 mg daily Plavix  75 mg daily Metoprolol  tartrate 25 mg twice daily Remainder reviewed in medication history.   Objective: BP (!) 145/99 (BP Location: Left Arm)   Pulse 87   Temp (!) 97.4 F (36.3 C) (Temporal)   Resp 15   Ht 6\' 1"  (1.854 m)   Wt 71.2 kg   SpO2 100%   BMI 20.71 kg/m  Exam: General: Awake and Alert in NAD HEENT: NCAT. Sclera anicteric. No rhinorrhea.  Enlarged tonsils R> L.  No exudate.  Enlarged uvula, midline.  Mild tenderness to palpation over right tonsil submandibularly. Cardiovascular: RRR. No M/R/G Respiratory: CTAB, normal WOB on RA. No wheezing, crackles, rhonchi, or diminished breath sounds. Abdomen: Soft, non-tender, non-distended. Bowel sounds normoactive Extremities: Able to move all extremities. No BLE edema, no deformities or significant joint findings. Skin: Warm and dry. No abrasions or rashes noted. Neuro: A&Ox3. No focal neurological deficits.  Labs:  CBC BMET  Recent Labs  Lab 01/13/24 0534  WBC 9.2  HGB 13.5  HCT 39.2  PLT 205   Recent Labs  Lab 01/13/24 0534  NA 138  K 3.0*  CL 104  CO2 23  BUN 6  CREATININE 0.91  GLUCOSE 104*  CALCIUM  8.7*     EKG: From December 2024 My own interpretation: Sinus tachycardia (123 bpm), QTc prolonged (475).  No ST elevation.  New study ordered  Imaging Studies Performed:  CT Soft Tissue Neck w/ contrast 1. Asymmetric right > left soft tissue swelling of the pharynx and the supraglottic larynx.  Epiglottis is spared, remains normal. But the uvula also appears somewhat edematous and enlarged. Main differential considerations are Acute Infectious Pharyngitis/Laryngitis (favored, see #2) and Angioedema.   2. Inflammation in the right parapharyngeal space tracking into the right submandibular space, and mild edema in the retropharyngeal space. Small but reactive appearing right cervical lymph nodes. But NO discrete suppuration or abscess. And no other complicating features identified.   3. Bilateral paranasal sinus inflammation.   4. Negative visible upper chest.   Paul Dark, DO 01/13/2024, 2:31 PM PGY-1, [facility] Family Medicine  FPTS Intern pager: [phone], text pages welcome Secure chat group [facility] Teaching Service    FPTS Upper-Level Resident Addendum   I have independently interviewed and examined the patient. I have discussed the above with Dr. Randeen Molina and agree with the documented plan. My edits for correction/addition/clarification are included above. Please see any attending notes.   Paul Gore, MD PGY-2, [place] [facility] 01/13/2024 2:36 PM  FPTS Service pager: [phone] (text pages welcome through [employer])

## 2024-01-14 ENCOUNTER — Other Ambulatory Visit (HOSPITAL_COMMUNITY): Payer: Self-pay

## 2024-01-14 DIAGNOSIS — J392 Other diseases of pharynx: Principal | ICD-10-CM

## 2024-01-14 DIAGNOSIS — J029 Acute pharyngitis, unspecified: Secondary | ICD-10-CM | POA: Diagnosis not present

## 2024-01-14 LAB — HEPATIC FUNCTION PANEL
ALT: 65 U/L — ABNORMAL HIGH (ref 0–44)
AST: 45 U/L — ABNORMAL HIGH (ref 15–41)
Albumin: 3.4 g/dL — ABNORMAL LOW (ref 3.5–5.0)
Alkaline Phosphatase: 61 U/L (ref 38–126)
Bilirubin, Direct: 0.2 mg/dL (ref 0.0–0.2)
Indirect Bilirubin: 0.7 mg/dL (ref 0.3–0.9)
Total Bilirubin: 0.9 mg/dL (ref 0.0–1.2)
Total Protein: 7.4 g/dL (ref 6.5–8.1)

## 2024-01-14 LAB — BASIC METABOLIC PANEL WITH GFR
Anion gap: 9 (ref 5–15)
BUN: 11 mg/dL (ref 6–20)
CO2: 21 mmol/L — ABNORMAL LOW (ref 22–32)
Calcium: 8.6 mg/dL — ABNORMAL LOW (ref 8.9–10.3)
Chloride: 105 mmol/L (ref 98–111)
Creatinine, Ser: 0.87 mg/dL (ref 0.61–1.24)
GFR, Estimated: >60 mL/min (ref >60)
Glucose, Bld: 157 mg/dL — ABNORMAL HIGH (ref 70–99)
Potassium: 3.6 mmol/L (ref 3.5–5.1)
Sodium: 135 mmol/L (ref 135–145)

## 2024-01-14 LAB — PROTIME-INR
INR: 1.1 (ref 0.8–1.2)
Prothrombin Time: 14.1 s (ref 11.4–15.2)

## 2024-01-14 MED ORDER — METHYLPREDNISOLONE 4 MG PO TBPK
ORAL_TABLET | ORAL | 0 refills | Status: DC
Start: 2024-01-14 — End: 2024-07-01
  Filled 2024-01-14: qty 21, 6d supply, fill #0

## 2024-01-14 MED ORDER — CLINDAMYCIN HCL 300 MG PO CAPS
300.0000 mg | ORAL_CAPSULE | Freq: Three times a day (TID) | ORAL | 0 refills | Status: AC
  Filled 2024-01-14: qty 27, 9d supply, fill #0

## 2024-01-14 NOTE — Discharge Summary (Signed)
 Family Medicine Teaching [facility] Discharge Summary  Patient name: Paul Molina Medical record number: [id number] Date of birth: [birth year] Age: 48 y.o. Gender: male Date of Admission: 01/13/2024  Date of Discharge: 01/14/24  Admitting Physician: Clyda Dark, DO  Primary Care Provider: Collins Dean, NP Consultants: ENT  Indication for Hospitalization: parapharyngeal swelling and dysphagia  Discharge Diagnoses/Problem List:  Principal Problem for Admission: Acute Pharyngitis Other Problems addressed during stay:  Principal Problem:   Acute pharyngitis Active Problems:   Hypokalemia   Prolonged QT interval   Elevated LFTs   Chronic health problem   Elevated bilirubin   Pharyngeal swelling   Pharyngitis  Brief Hospital Course:  Paul Molina is a 48 y.o.male with a history of HTN, CAD, HLD, Anxiety, TMJ who was admitted to the [facility] Medicine Teaching Service at [facility] for parapharyngeal and uvular swelling. His hospital course is detailed below:  Acute Pharyngitis Patient presented with sore throat, dysphagia, and tonsillar/uvular swelling.  Clear airway with no respiratory distress.  CT soft tissue neck demonstrated asymmetric R>L swelling of the pharynx and supraglottic larynx, edematous/enlarged uvula, and inflammation in the R parapharyngeal space and mild edema in the retropharyngeal space without abscess formation.  ENT was consulted in the ED and recommended Clindamycin and Decadron inpatient.  Advised to continue upon discharge with Clindamycin 300 mg TID to complete 10 day course on 6/17 and Medrol Dose Pack.  Dysphagia and pain improved prior to discharge.  Return precautions were advised.   Hypokalemia K 3.0 on admission, repleted with IV and PO K with improvement.   Other chronic conditions were medically managed with home medications and formulary alternatives as necessary (HTN, CAD, HLD, Smoking history)  PCP Follow-up Recommendations: Ensure  completion of antibiotic and steroid course.    Disposition: Home  Discharge Condition: Stable  Discharge Exam:  Vitals:   01/14/24 0440 01/14/24 0749  BP: (!) 128/90 (!) 147/94  Pulse: 76 80  Resp: 18 17  Temp: 97.8 F (36.6 C) 98 F (36.7 C)  SpO2: 100% 96%   General: Awake and Alert in NAD HEENT: NCAT. Sclera anicteric. No rhinorrhea. Improved tonsillar and uvular swelling. No TTP over tonsils submandibularly.  No oropharyngeal erythema or exudate.  Cardiovascular: RRR. No M/R/G Respiratory: CTAB, normal WOB on RA. No wheezing, crackles, rhonchi, or diminished breath sounds. Abdomen: Soft, non-tender, non-distended. Bowel sounds normoactive Extremities: Able to move all extremities. No BLE edema, no deformities or significant joint findings. Skin: Warm and dry. No abrasions or rashes noted. Neuro: A&Ox3. No focal neurological deficits.  Significant Procedures: none  Significant Labs and Imaging:  Recent Labs  Lab 01/13/24 0534  WBC 9.2  HGB 13.5  HCT 39.2  PLT 205   Recent Labs  Lab 01/13/24 0534 01/14/24 0602  NA 138 135  K 3.0* 3.6  CL 104 105  CO2 23 21*  GLUCOSE 104* 157*  BUN 6 11  CREATININE 0.91 0.87  CALCIUM  8.7* 8.6*  ALKPHOS 66 61  AST 90* 45*  ALT 103* 65*  ALBUMIN 3.9 3.4*    CT Soft Tissue Neck with contrast 1. Asymmetric right > left soft tissue swelling of the pharynx and the supraglottic larynx. Epiglottis is spared, remains normal. But the uvula also appears somewhat edematous and enlarged. Main differential considerations are Acute Infectious Pharyngitis/Laryngitis (favored, see #2) and Angioedema.   2. Inflammation in the right parapharyngeal space tracking into the right submandibular space, and mild edema in the retropharyngeal space. Small but reactive appearing right  cervical lymph nodes. But NO discrete suppuration or abscess. And no other complicating features identified.   3. Bilateral paranasal sinus inflammation.   4.  Negative visible upper chest.  Results/Tests Pending at Time of Discharge: none  Discharge Medications:  Allergies as of 01/14/2024   No Known Allergies      Medication List     TAKE these medications    amLODipine  10 MG tablet Commonly known as: NORVASC  Take 1 tablet (10 mg total) by mouth daily for 15 days.   Aspirin  Low Dose 81 MG chewable tablet Generic drug: aspirin  Chew 1 tablet (81 mg total) by mouth daily.   atorvastatin  80 MG tablet Commonly known as: LIPITOR  Take 1 tablet (80 mg total) by mouth daily.   clindamycin 300 MG capsule Commonly known as: CLEOCIN Take 1 capsule (300 mg total) by mouth 3 (three) times daily for 9 days.   clopidogrel  75 MG tablet Commonly known as: PLAVIX  Take 1 tablet (75 mg total) by mouth daily with breakfast.   methylPREDNISolone 4 MG Tbpk tablet Commonly known as: MEDROL DOSEPAK Take as directed on packet.   metoprolol  tartrate 25 MG tablet Commonly known as: LOPRESSOR  Take 1 tablet (25 mg total) by mouth 2 (two) times daily.        Discharge Instructions: Please refer to Patient Instructions section of EMR for full details.  Patient was counseled important signs and symptoms that should prompt return to medical care, changes in medications, dietary instructions, activity restrictions, and follow up appointments.   Clyda Dark, DO 01/14/2024, 9:53 AM PGY-1, [facility] Family Medicine

## 2024-01-14 NOTE — Plan of Care (Signed)

## 2024-01-14 NOTE — Progress Notes (Signed)
 ENT Plan of Care: Evaluated patient. Full note to follow up but significantly improved on abx/steroids [facility] from ENT standpoint to d/c on clinda TID PO x10d and medrol dose pack and discharge w/ return precautions Will get f/u in 2 weeks Please notify with questions Evelina Hippo

## 2024-01-14 NOTE — Discharge Instructions (Addendum)
 Dear Paul Molina,  Thank you for letting us  participate in your care. You were hospitalized for difficulty swallowing and throat swelling and diagnosed with Acute pharyngitis. You were treated with steroids and IV antibiotics. We will discharge you with oral antibiotics (Clindamycin 300 mg) please take this three times a day and for the next 9 days.   You are given a steroid taper pack - you will take one less tablet each day until you complete the steroid taper. You will start with 6 pills, decrease one pill each day until you complete the packet.   POST-HOSPITAL & CARE INSTRUCTIONS Follow up with your PCP if any new symptoms develop. If you have worsening pain, swelling, difficulty swallowing, or difficulty breathing please return to the ED to be evaluated.  Go to your follow up appointments (listed below)  DOCTOR'S APPOINTMENT   No future appointments.   Take care and be well!  Family Medicine Teaching Service Inpatient Team [place]  [facility]  [address] [place], [place] [address] [phone]

## 2024-01-15 ENCOUNTER — Telehealth: Payer: Self-pay

## 2024-01-15 ENCOUNTER — Other Ambulatory Visit: Payer: Self-pay | Admitting: Family Medicine

## 2024-01-15 ENCOUNTER — Other Ambulatory Visit: Payer: Self-pay

## 2024-01-15 ENCOUNTER — Ambulatory Visit: Payer: Self-pay | Admitting: Family Medicine

## 2024-01-15 LAB — CULTURE, GROUP A STREP (THRC)

## 2024-01-15 MED ORDER — AMOXICILLIN 875 MG PO TABS
875.0000 mg | ORAL_TABLET | Freq: Two times a day (BID) | ORAL | 0 refills | Status: AC
  Filled 2024-01-15: qty 16, 8d supply, fill #0

## 2024-01-15 NOTE — Progress Notes (Signed)
 Strep culture grew rare streptococcus beta hemolytic, not group A. Will switch antibiotics from Clindamycin to Amoxicillin 875 mg BID for 8 more days, to complete full 10-day-course of antibiotics on 6/17.  Called patient and relayed this information, all questions answered. Patient very appreciative of our care while he was hospitalized.   Clyda Dark, DO 01/15/24 3:08 PM

## 2024-01-15 NOTE — Transitions of Care (Post Inpatient/ED Visit) (Signed)
   01/15/2024  Name: Paul Molina MRN: [id number] DOB: Apr 03, 1976  Today's TOC FU Call Status: Today's TOC FU Call Status:: Successful TOC FU Call Completed TOC FU Call Complete Date: 01/15/24 Patient's Name and Date of Birth confirmed.  Transition Care Management Follow-up Telephone Call Date of Discharge: 01/15/24 Discharge Facility: [facility]) Type of Discharge: Inpatient Admission Primary Inpatient Discharge Diagnosis:: acute pharyngitis How have you been since you were released from the hospital?: Better Any questions or concerns?: No  Items Reviewed: Did you receive and understand the discharge instructions provided?: Yes Medications obtained,verified, and reconciled?: No Medications Not Reviewed Reasons:: Other: (He said he has all of his medications and did not need to review the med list because they went over it thoroughy with him before he left the hospital) Any new allergies since your discharge?: No Dietary orders reviewed?: Yes Type of Diet Ordered:: heart healthy, low sodium Do you have support at home?:  (He said he doen't need any help.)  Medications Reviewed Today: Medications Reviewed Today   Medications were not reviewed in this encounter     Home Care and Equipment/Supplies: Were Home Health Services Ordered?: No Any new equipment or medical supplies ordered?: No  Functional Questionnaire: Do you need assistance with bathing/showering or dressing?: No Do you need assistance with meal preparation?: No Do you need assistance with eating?: No Do you have difficulty maintaining continence: No Do you need assistance with getting out of bed/getting out of a chair/moving?: No Do you have difficulty managing or taking your medications?: No  Follow up appointments reviewed: PCP Follow-up appointment confirmed?: Yes Date of PCP follow-up appointment?: 02/25/24 Follow-up Provider: Winda Hastings, NP.  He said he can come to an appt on Monday. I told him  the first open Mon for Ms Jamal Mays, NP is 02/25/2024.  He took that appt. I explained to him that it is very important he keep that appt with his PCP.  I also explained that if he needs medication  refills and the appointments at [facility] are limited, we have the [facility] where he can go for lab work. med refills and he said he understood, [facility] Follow-up appointment confirmed?: NA Do you need transportation to your follow-up appointment?: No Do you understand care options if your condition(s) worsen?: Yes-patient verbalized understanding    SIGNATURE  Burnett Carson, RN

## 2024-01-16 ENCOUNTER — Other Ambulatory Visit: Payer: Self-pay

## 2024-01-17 DIAGNOSIS — Z419 Encounter for procedure for purposes other than remedying health state, unspecified: Secondary | ICD-10-CM | POA: Diagnosis not present

## 2024-02-05 ENCOUNTER — Other Ambulatory Visit: Payer: Self-pay

## 2024-02-05 ENCOUNTER — Other Ambulatory Visit: Payer: Self-pay | Admitting: Nurse Practitioner

## 2024-02-05 ENCOUNTER — Other Ambulatory Visit: Payer: Self-pay | Admitting: Family Medicine

## 2024-02-05 DIAGNOSIS — I10 Essential (primary) hypertension: Secondary | ICD-10-CM

## 2024-02-06 ENCOUNTER — Other Ambulatory Visit: Payer: Self-pay | Admitting: Nurse Practitioner

## 2024-02-06 ENCOUNTER — Other Ambulatory Visit: Payer: Self-pay | Admitting: Family Medicine

## 2024-02-06 DIAGNOSIS — I10 Essential (primary) hypertension: Secondary | ICD-10-CM

## 2024-02-13 ENCOUNTER — Other Ambulatory Visit: Payer: Self-pay

## 2024-02-13 ENCOUNTER — Encounter: Payer: Self-pay | Admitting: Pharmacist

## 2024-02-15 ENCOUNTER — Other Ambulatory Visit: Payer: Self-pay | Admitting: Nurse Practitioner

## 2024-02-15 ENCOUNTER — Other Ambulatory Visit: Payer: Self-pay | Admitting: Family Medicine

## 2024-02-15 ENCOUNTER — Other Ambulatory Visit: Payer: Self-pay

## 2024-02-15 ENCOUNTER — Encounter: Payer: Self-pay | Admitting: Nurse Practitioner

## 2024-02-15 DIAGNOSIS — I10 Essential (primary) hypertension: Secondary | ICD-10-CM

## 2024-02-15 NOTE — Telephone Encounter (Signed)
 You can schedule him for Monday 1110 or 130

## 2024-02-15 NOTE — Telephone Encounter (Signed)
IN-person

## 2024-02-16 DIAGNOSIS — Z419 Encounter for procedure for purposes other than remedying health state, unspecified: Secondary | ICD-10-CM | POA: Diagnosis not present

## 2024-02-18 ENCOUNTER — Ambulatory Visit: Admitting: Nurse Practitioner

## 2024-02-18 ENCOUNTER — Ambulatory Visit: Admitting: Physician Assistant

## 2024-02-18 ENCOUNTER — Encounter: Payer: Self-pay | Admitting: Physician Assistant

## 2024-02-18 ENCOUNTER — Other Ambulatory Visit: Payer: Self-pay

## 2024-02-18 VITALS — BP 156/101 | HR 85 | Ht 73.0 in | Wt 162.0 lb

## 2024-02-18 DIAGNOSIS — Z8639 Personal history of other endocrine, nutritional and metabolic disease: Secondary | ICD-10-CM

## 2024-02-18 DIAGNOSIS — Z87891 Personal history of nicotine dependence: Secondary | ICD-10-CM | POA: Diagnosis not present

## 2024-02-18 DIAGNOSIS — R7989 Other specified abnormal findings of blood chemistry: Secondary | ICD-10-CM | POA: Diagnosis not present

## 2024-02-18 DIAGNOSIS — R739 Hyperglycemia, unspecified: Secondary | ICD-10-CM

## 2024-02-18 DIAGNOSIS — F419 Anxiety disorder, unspecified: Secondary | ICD-10-CM | POA: Diagnosis not present

## 2024-02-18 DIAGNOSIS — R079 Chest pain, unspecified: Secondary | ICD-10-CM

## 2024-02-18 DIAGNOSIS — I25110 Atherosclerotic heart disease of native coronary artery with unstable angina pectoris: Secondary | ICD-10-CM

## 2024-02-18 DIAGNOSIS — E785 Hyperlipidemia, unspecified: Secondary | ICD-10-CM | POA: Diagnosis not present

## 2024-02-18 DIAGNOSIS — I517 Cardiomegaly: Secondary | ICD-10-CM

## 2024-02-18 DIAGNOSIS — I10 Essential (primary) hypertension: Secondary | ICD-10-CM | POA: Diagnosis not present

## 2024-02-18 DIAGNOSIS — Z5986 Financial insecurity: Secondary | ICD-10-CM | POA: Diagnosis not present

## 2024-02-18 DIAGNOSIS — Z7982 Long term (current) use of aspirin: Secondary | ICD-10-CM | POA: Diagnosis not present

## 2024-02-18 DIAGNOSIS — I25119 Atherosclerotic heart disease of native coronary artery with unspecified angina pectoris: Secondary | ICD-10-CM | POA: Diagnosis not present

## 2024-02-18 DIAGNOSIS — Z8249 Family history of ischemic heart disease and other diseases of the circulatory system: Secondary | ICD-10-CM | POA: Diagnosis not present

## 2024-02-18 LAB — POCT GLYCOSYLATED HEMOGLOBIN (HGB A1C): Hemoglobin A1C: 4.7 % (ref 4.0–5.6)

## 2024-02-18 MED ORDER — AMLODIPINE BESYLATE 10 MG PO TABS
10.0000 mg | ORAL_TABLET | Freq: Every day | ORAL | 1 refills | Status: DC
Start: 2024-02-18 — End: 2024-04-12
  Filled 2024-02-18: qty 30, 30d supply, fill #0
  Filled 2024-03-17: qty 30, 30d supply, fill #1

## 2024-02-18 MED ORDER — CLOPIDOGREL BISULFATE 75 MG PO TABS
75.0000 mg | ORAL_TABLET | Freq: Every day | ORAL | 1 refills | Status: DC
  Filled 2024-02-18: qty 30, 30d supply, fill #0
  Filled 2024-03-17: qty 30, 30d supply, fill #1

## 2024-02-18 MED ORDER — METOPROLOL TARTRATE 25 MG PO TABS
25.0000 mg | ORAL_TABLET | Freq: Two times a day (BID) | ORAL | 1 refills | Status: DC
Start: 2024-02-18 — End: 2024-04-12
  Filled 2024-02-18: qty 60, 30d supply, fill #0
  Filled 2024-03-17: qty 60, 30d supply, fill #1

## 2024-02-18 MED ORDER — ATORVASTATIN CALCIUM 80 MG PO TABS
80.0000 mg | ORAL_TABLET | Freq: Every day | ORAL | 1 refills | Status: DC
  Filled 2024-02-18: qty 30, 30d supply, fill #0
  Filled 2024-03-17: qty 30, 30d supply, fill #1

## 2024-02-18 MED ORDER — ASPIRIN 81 MG PO CHEW
81.0000 mg | CHEWABLE_TABLET | Freq: Every day | ORAL | 1 refills | Status: AC
  Filled 2024-02-18: qty 90, 90d supply, fill #0
  Filled 2024-03-17: qty 30, 30d supply, fill #0
  Filled 2024-04-12: qty 30, 30d supply, fill #1
  Filled 2024-06-02: qty 30, 30d supply, fill #2
  Filled 2024-06-28: qty 30, 30d supply, fill #3
  Filled 2024-08-07: qty 30, 30d supply, fill #4
  Filled 2024-09-02: qty 30, 30d supply, fill #5

## 2024-02-18 NOTE — Progress Notes (Unsigned)
   Established Patient Office Visit  Subjective   Patient ID: Paul Molina, male    DOB: 07-21-1976  Age: 48 y.o. MRN: [id number]  Chief Complaint  Patient presents with   Medication Refill    Patient has been without medication x3 or more days. He c/o slight pain in chest, he has been felling throughout the day.   Discussed the use of AI scribe software for clinical note transcription with the patient, who gave verbal consent to proceed.  History of Present Illness Hx of hypokalemia     HPI  {History (Optional):23778}  ROS    Objective:     BP (!) 156/101 (BP Location: Left Arm, Patient Position: Sitting, Cuff Size: Normal)   Pulse 85   Ht 6' 1 (1.854 m)   Wt 162 lb (73.5 kg)   SpO2 97%   BMI 21.37 kg/m  {Vitals History (Optional):23777}  Physical Exam   No results found for any visits on 02/18/24.  {Labs (Optional):23779}  The 10-year ASCVD risk score (Arnett DK, et al., 2019) is: 16.7%    Assessment & Plan:   Problem List Items Addressed This Visit       Cardiovascular and Mediastinum   Essential hypertension (Chronic)   Left ventricular hypertrophy (Chronic)   Relevant Orders   Ambulatory referral to Cardiology   Coronary artery disease involving native coronary artery of native heart with unstable angina pectoris [facility])   Relevant Orders   Ambulatory referral to Cardiology     Other   Elevated LFTs   Relevant Orders   Hepatic Function Panel   Other Visit Diagnoses       Chest pain, unspecified type    -  Primary   Relevant Orders   EKG 12-Lead   TSH   Ambulatory referral to Cardiology     Primary hypertension           No follow-ups on file.    Kirk RAMAN Mayers, PA-C

## 2024-02-18 NOTE — Patient Instructions (Addendum)
 VISIT SUMMARY:  You came in today because you have been experiencing sharp, intermittent pain in the middle of your chest, above the left breastbone. This pain started today and lasts a few seconds each time. You have a history of coronary artery disease and had a stent placed last November. You mentioned that you have been off some of your medications for four to five days. You were recently hospitalized and treated with antibiotics, and your liver enzymes were slightly elevated upon discharge.  YOUR PLAN:  -INTERMITTENT CHEST PAIN: Intermittent chest pain can be related to your heart, especially given your history of coronary artery disease and recent stent placement.  Your EKG did not show any acute changes.  You will restart your medications, I have started a referral for you to be seen by cardiology, and I do encourage you to keep your appointment with your primary care provider in 1 week or return to the mobile unit for follow-up.  We are also checking your thyroid function as well as your metabolic panel given your history of low potassium.    Nonspecific Chest Pain, Adult Chest pain is an uncomfortable, tight, or painful feeling in the chest. The pain can feel like a crushing, aching, or squeezing pressure. A person can feel a burning or tingling sensation. Chest pain can also be felt in your back, neck, jaw, shoulder, or arm. This pain can be worse when you move, sneeze, or take a deep breath. Chest pain can be caused by a condition that is life-threatening. This must be treated right away. It can also be caused by something that is not life-threatening. If you have chest pain, it can be hard to know the difference, so it is important to get help right away to make sure that you do not have a serious condition. Some life-threatening causes of chest pain include: Heart attack. A tear in the body's main blood vessel (aortic dissection). Inflammation around your heart (pericarditis). A problem  in the lungs, such as a blood clot (pulmonary embolism) or a collapsed lung (pneumothorax). Some non life-threatening causes of chest pain include: Heartburn. Anxiety or stress. Damage to the bones, muscles, and cartilage that make up your chest wall. Pneumonia or bronchitis. Shingles infection (varicella-zoster virus). Your chest pain may come and go. It may also be constant. Your health care provider will do tests and other studies to find the cause of your pain. Treatment will depend on the cause of your chest pain. Follow these instructions at home: Medicines Take over-the-counter and prescription medicines only as told by your health care provider. If you were prescribed an antibiotic medicine, take it as told by your health care provider. Do not stop taking the antibiotic even if you start to feel better. Activity Avoid any activities that cause chest pain. Do not lift anything that is heavier than 10 lb (4.5 kg), or the limit that you are told, until your health care provider says that it is safe. Rest as directed by your health care provider. Return to your normal activities only as told by your health care provider. Ask your health care provider what activities are safe for you. Lifestyle     Do not use any products that contain nicotine  or tobacco, such as cigarettes, e-cigarettes, and chewing tobacco. If you need help quitting, ask your health care provider. Do not drink alcohol. Make healthy lifestyle changes as recommended. These may include: Getting regular exercise. Ask your health care provider to suggest some exercises that  are safe for you. Eating a heart-healthy diet. This includes plenty of fresh fruits and vegetables, whole grains, low-fat (lean) protein, and low-fat dairy products. A dietitian can help you find healthy eating options. Maintaining a healthy weight. Managing any other health conditions you may have, such as high blood pressure (hypertension) or  diabetes. Reducing stress, such as with yoga or relaxation techniques. General instructions Pay attention to any changes in your symptoms. It is up to you to get the results of any tests that were done. Ask your health care provider, or the department that is doing the tests, when your results will be ready. Keep all follow-up visits as told by your health care provider. This is important. You may be asked to go for further testing if your chest pain does not go away. Contact a health care provider if: Your chest pain does not go away. You feel depressed. You have a fever. You notice changes in your symptoms or develop new symptoms. Get help right away if: Your chest pain gets worse. You have a cough that gets worse, or you cough up blood. You have severe pain in your abdomen. You faint. You have sudden, unexplained chest discomfort. You have sudden, unexplained discomfort in your arms, back, neck, or jaw. You have shortness of breath at any time. You suddenly start to sweat, or your skin gets clammy. You feel nausea or you vomit. You suddenly feel lightheaded or dizzy. You have severe weakness, or unexplained weakness or fatigue. Your heart begins to beat quickly, or it feels like it is skipping beats. These symptoms may represent a serious problem that is an emergency. Do not wait to see if the symptoms will go away. Get medical help right away. Call your local emergency services (911 in the [place] Do not drive yourself to the hospital. Summary Chest pain can be caused by a condition that is serious and requires urgent treatment. It may also be caused by something that is not life-threatening. Your health care provider may do lab tests and other studies to find the cause of your pain. Follow your health care provider's instructions on taking medicines, making lifestyle changes, and getting emergency treatment if symptoms become worse. Keep all follow-up visits as told by your health  care provider. This includes visits for any further testing if your chest pain does not go away. This information is not intended to replace advice given to you by your health care provider. Make sure you discuss any questions you have with your health care provider. Document Revised: 06/08/2022 Document Reviewed: 06/08/2022 Elsevier Patient Education  2024 [employer].

## 2024-02-19 ENCOUNTER — Encounter: Payer: Self-pay | Admitting: Physician Assistant

## 2024-02-19 DIAGNOSIS — Z8639 Personal history of other endocrine, nutritional and metabolic disease: Secondary | ICD-10-CM | POA: Insufficient documentation

## 2024-02-20 ENCOUNTER — Ambulatory Visit: Payer: Self-pay | Admitting: Physician Assistant

## 2024-02-20 DIAGNOSIS — Z8639 Personal history of other endocrine, nutritional and metabolic disease: Secondary | ICD-10-CM

## 2024-02-20 LAB — BASIC METABOLIC PANEL WITH GFR
BUN/Creatinine Ratio: 6 — ABNORMAL LOW (ref 9–20)
BUN: 6 mg/dL (ref 6–24)
CO2: 19 mmol/L — ABNORMAL LOW (ref 20–29)
Calcium: 9.9 mg/dL (ref 8.7–10.2)
Chloride: 91 mmol/L — ABNORMAL LOW (ref 96–106)
Creatinine, Ser: 1.09 mg/dL (ref 0.76–1.27)
Sodium: 142 mmol/L (ref 134–144)
eGFR: 84 mL/min/1.73 (ref >59)

## 2024-02-20 LAB — HEPATIC FUNCTION PANEL
ALT: 27 IU/L (ref 0–44)
AST: 30 IU/L (ref 0–40)
Albumin: 5.2 g/dL — ABNORMAL HIGH (ref 4.1–5.1)
Alkaline Phosphatase: 102 IU/L (ref 44–121)
Bilirubin Total: 0.6 mg/dL (ref 0.0–1.2)
Bilirubin, Direct: 0.24 mg/dL (ref 0.00–0.40)
Total Protein: 8.7 g/dL — ABNORMAL HIGH (ref 6.0–8.5)

## 2024-02-20 LAB — TSH: TSH: 0.589 u[IU]/mL (ref 0.450–4.500)

## 2024-02-22 ENCOUNTER — Telehealth: Payer: Self-pay | Admitting: Nurse Practitioner

## 2024-02-22 NOTE — Telephone Encounter (Signed)
 Contacted pt lvm to confirmed appt

## 2024-02-25 ENCOUNTER — Ambulatory Visit: Admitting: Nurse Practitioner

## 2024-03-05 ENCOUNTER — Telehealth: Payer: Self-pay | Admitting: Nurse Practitioner

## 2024-03-05 NOTE — Telephone Encounter (Addendum)
 Called patient, no answer. Left voicemail confirming upcoming appointment on 03/07/2024 at 8:30 am.  Provided callback number for any questions or changes.

## 2024-03-07 ENCOUNTER — Ambulatory Visit: Admitting: Nurse Practitioner

## 2024-03-18 ENCOUNTER — Other Ambulatory Visit: Payer: Self-pay

## 2024-03-18 DIAGNOSIS — Z419 Encounter for procedure for purposes other than remedying health state, unspecified: Secondary | ICD-10-CM | POA: Diagnosis not present

## 2024-04-12 ENCOUNTER — Other Ambulatory Visit: Payer: Self-pay | Admitting: Physician Assistant

## 2024-04-12 DIAGNOSIS — I10 Essential (primary) hypertension: Secondary | ICD-10-CM

## 2024-04-12 DIAGNOSIS — I25110 Atherosclerotic heart disease of native coronary artery with unstable angina pectoris: Secondary | ICD-10-CM

## 2024-04-14 ENCOUNTER — Other Ambulatory Visit: Payer: Self-pay

## 2024-04-14 MED ORDER — ATORVASTATIN CALCIUM 80 MG PO TABS
80.0000 mg | ORAL_TABLET | Freq: Every day | ORAL | 1 refills | Status: DC
  Filled 2024-04-14 – 2024-04-26 (×2): qty 30, 30d supply, fill #0
  Filled 2024-06-02: qty 30, 30d supply, fill #1

## 2024-04-14 MED ORDER — METOPROLOL TARTRATE 25 MG PO TABS
25.0000 mg | ORAL_TABLET | Freq: Two times a day (BID) | ORAL | 1 refills | Status: DC
  Filled 2024-04-14 – 2024-04-26 (×2): qty 60, 30d supply, fill #0
  Filled 2024-06-02: qty 60, 30d supply, fill #1

## 2024-04-14 MED ORDER — AMLODIPINE BESYLATE 10 MG PO TABS
10.0000 mg | ORAL_TABLET | Freq: Every day | ORAL | 1 refills | Status: DC
  Filled 2024-04-14 – 2024-04-26 (×2): qty 30, 30d supply, fill #0
  Filled 2024-06-02: qty 30, 30d supply, fill #1

## 2024-04-18 DIAGNOSIS — Z419 Encounter for procedure for purposes other than remedying health state, unspecified: Secondary | ICD-10-CM | POA: Diagnosis not present

## 2024-04-21 ENCOUNTER — Other Ambulatory Visit: Payer: Self-pay

## 2024-04-23 ENCOUNTER — Other Ambulatory Visit: Payer: Self-pay

## 2024-04-28 ENCOUNTER — Other Ambulatory Visit: Payer: Self-pay

## 2024-04-28 ENCOUNTER — Other Ambulatory Visit: Payer: Self-pay | Admitting: Physician Assistant

## 2024-04-28 DIAGNOSIS — I25110 Atherosclerotic heart disease of native coronary artery with unstable angina pectoris: Secondary | ICD-10-CM

## 2024-04-29 NOTE — Telephone Encounter (Signed)
 Needs to schedule with Cardiology. No additional refills of plavix  until scheduled   Placed in [facility] AT [facility]  [address] [place], [place] [address] [phone]

## 2024-05-31 ENCOUNTER — Encounter: Payer: Self-pay | Admitting: Nurse Practitioner

## 2024-06-02 ENCOUNTER — Other Ambulatory Visit: Payer: Self-pay

## 2024-06-02 ENCOUNTER — Other Ambulatory Visit: Payer: Self-pay | Admitting: Nurse Practitioner

## 2024-06-02 DIAGNOSIS — I25110 Atherosclerotic heart disease of native coronary artery with unstable angina pectoris: Secondary | ICD-10-CM

## 2024-06-02 MED ORDER — CLOPIDOGREL BISULFATE 75 MG PO TABS
75.0000 mg | ORAL_TABLET | Freq: Every day | ORAL | 1 refills | Status: DC
  Filled 2024-06-02: qty 30, 30d supply, fill #0
  Filled 2024-06-28: qty 30, 30d supply, fill #1

## 2024-06-03 ENCOUNTER — Other Ambulatory Visit: Payer: Self-pay

## 2024-06-04 ENCOUNTER — Other Ambulatory Visit: Payer: Self-pay

## 2024-06-04 NOTE — Telephone Encounter (Signed)
Medication has been filled and picked up.

## 2024-06-18 DIAGNOSIS — Z419 Encounter for procedure for purposes other than remedying health state, unspecified: Secondary | ICD-10-CM | POA: Diagnosis not present

## 2024-06-28 ENCOUNTER — Other Ambulatory Visit: Payer: Self-pay | Admitting: Nurse Practitioner

## 2024-06-28 DIAGNOSIS — I10 Essential (primary) hypertension: Secondary | ICD-10-CM

## 2024-06-28 DIAGNOSIS — I25110 Atherosclerotic heart disease of native coronary artery with unstable angina pectoris: Secondary | ICD-10-CM

## 2024-06-30 ENCOUNTER — Encounter: Payer: Self-pay | Admitting: Nurse Practitioner

## 2024-06-30 ENCOUNTER — Other Ambulatory Visit: Payer: Self-pay

## 2024-06-30 ENCOUNTER — Other Ambulatory Visit: Payer: Self-pay | Admitting: Nurse Practitioner

## 2024-06-30 DIAGNOSIS — I10 Essential (primary) hypertension: Secondary | ICD-10-CM

## 2024-06-30 DIAGNOSIS — I25110 Atherosclerotic heart disease of native coronary artery with unstable angina pectoris: Secondary | ICD-10-CM

## 2024-07-01 ENCOUNTER — Other Ambulatory Visit: Payer: Self-pay | Admitting: Nurse Practitioner

## 2024-07-01 ENCOUNTER — Other Ambulatory Visit: Payer: Self-pay

## 2024-07-01 ENCOUNTER — Ambulatory Visit: Admitting: Physician Assistant

## 2024-07-01 DIAGNOSIS — I25110 Atherosclerotic heart disease of native coronary artery with unstable angina pectoris: Secondary | ICD-10-CM

## 2024-07-01 DIAGNOSIS — I10 Essential (primary) hypertension: Secondary | ICD-10-CM

## 2024-07-01 MED ORDER — ATORVASTATIN CALCIUM 80 MG PO TABS
80.0000 mg | ORAL_TABLET | Freq: Every day | ORAL | 0 refills | Status: AC
  Filled 2024-07-01: qty 30, 30d supply, fill #0
  Filled 2024-08-07: qty 30, 30d supply, fill #1
  Filled 2024-09-02: qty 30, 30d supply, fill #2

## 2024-07-01 MED ORDER — AMLODIPINE BESYLATE 10 MG PO TABS
10.0000 mg | ORAL_TABLET | Freq: Every day | ORAL | 0 refills | Status: AC
  Filled 2024-07-01: qty 30, 30d supply, fill #0
  Filled 2024-08-07: qty 30, 30d supply, fill #1
  Filled 2024-09-02: qty 30, 30d supply, fill #2

## 2024-07-01 MED ORDER — METOPROLOL TARTRATE 25 MG PO TABS
25.0000 mg | ORAL_TABLET | Freq: Two times a day (BID) | ORAL | 0 refills | Status: AC
  Filled 2024-07-01: qty 60, 30d supply, fill #0
  Filled 2024-08-07: qty 60, 30d supply, fill #1
  Filled 2024-09-02: qty 60, 30d supply, fill #2

## 2024-07-03 NOTE — Progress Notes (Signed)
 Patient left before being seen.

## 2024-07-18 DIAGNOSIS — Z419 Encounter for procedure for purposes other than remedying health state, unspecified: Secondary | ICD-10-CM | POA: Diagnosis not present

## 2024-07-23 ENCOUNTER — Ambulatory Visit: Admitting: Nurse Practitioner

## 2024-08-07 ENCOUNTER — Other Ambulatory Visit: Payer: Self-pay | Admitting: Nurse Practitioner

## 2024-08-07 DIAGNOSIS — I25110 Atherosclerotic heart disease of native coronary artery with unstable angina pectoris: Secondary | ICD-10-CM

## 2024-08-08 ENCOUNTER — Encounter (HOSPITAL_COMMUNITY): Payer: Self-pay

## 2024-08-08 ENCOUNTER — Other Ambulatory Visit: Payer: Self-pay

## 2024-08-08 ENCOUNTER — Emergency Department (HOSPITAL_COMMUNITY)

## 2024-08-08 ENCOUNTER — Emergency Department (HOSPITAL_COMMUNITY)
Admission: EM | Admit: 2024-08-08 | Discharge: 2024-08-08 | Disposition: A | Discharge status: Home / Self Care | Attending: Emergency Medicine | Admitting: Emergency Medicine

## 2024-08-08 DIAGNOSIS — R079 Chest pain, unspecified: Secondary | ICD-10-CM

## 2024-08-08 DIAGNOSIS — Z79899 Other long term (current) drug therapy: Secondary | ICD-10-CM | POA: Diagnosis not present

## 2024-08-08 DIAGNOSIS — R0789 Other chest pain: Secondary | ICD-10-CM | POA: Insufficient documentation

## 2024-08-08 DIAGNOSIS — Z7902 Long term (current) use of antithrombotics/antiplatelets: Secondary | ICD-10-CM | POA: Diagnosis not present

## 2024-08-08 DIAGNOSIS — F17200 Nicotine dependence, unspecified, uncomplicated: Secondary | ICD-10-CM | POA: Diagnosis not present

## 2024-08-08 DIAGNOSIS — I10 Essential (primary) hypertension: Secondary | ICD-10-CM | POA: Insufficient documentation

## 2024-08-08 LAB — CBC
HCT: 41.9 % (ref 39.0–52.0)
Hemoglobin: 14.6 g/dL (ref 13.0–17.0)
MCH: 35.4 pg — ABNORMAL HIGH (ref 26.0–34.0)
MCHC: 34.8 g/dL (ref 30.0–36.0)
MCV: 101.5 fL — ABNORMAL HIGH (ref 80.0–100.0)
Platelets: 227 K/uL (ref 150–400)
RBC: 4.13 MIL/uL — ABNORMAL LOW (ref 4.22–5.81)
RDW: 12.6 % (ref 11.5–15.5)
WBC: 5.2 K/uL (ref 4.0–10.5)
nRBC: 0 % (ref 0.0–0.2)

## 2024-08-08 LAB — TROPONIN T, HIGH SENSITIVITY
Troponin T High Sensitivity: <15 ng/L (ref 0–19)
Troponin T High Sensitivity: <15 ng/L (ref 0–19)

## 2024-08-08 LAB — BASIC METABOLIC PANEL WITH GFR
Anion gap: 13 (ref 5–15)
BUN: 10 mg/dL (ref 6–20)
CO2: 26 mmol/L (ref 22–32)
Calcium: 9.1 mg/dL (ref 8.9–10.3)
Chloride: 101 mmol/L (ref 98–111)
Creatinine, Ser: 0.89 mg/dL (ref 0.61–1.24)
GFR, Estimated: >60 mL/min
Glucose, Bld: 94 mg/dL (ref 70–99)
Potassium: 3.9 mmol/L (ref 3.5–5.1)
Sodium: 140 mmol/L (ref 135–145)

## 2024-08-08 NOTE — ED Provider Notes (Signed)
 " [place] EMERGENCY DEPARTMENT AT [facility] Provider Note   CSN: [id number] Arrival date & time: 08/08/24  0000     Patient presents with: Chest Pain   Paul Molina is a 49 y.o. male.  Patient with past medical history significant for hypertension, LVH, left sided heart cath with stent in November 2024 presents to the emergency department complaining of chest pressure which began a few hours prior to arrival.  He denied associated shortness of breath, nausea, vomiting, radiation of symptoms.  Upon initial presentation pain was 8 out of 10 in severity but at the time of triage was 3 out of 10 in severity.  Currently the patient is asymptomatic.     Chest Pain      Prior to Admission medications  Medication Sig Start Date End Date Taking? Authorizing Provider  amLODipine  (NORVASC ) 10 MG tablet Take 1 tablet (10 mg total) by mouth daily. 07/01/24  Yes Theotis Haze ORN, NP  aspirin  81 MG chewable tablet Chew 1 tablet (81 mg total) by mouth daily. 02/18/24  Yes Mayers, Cari S, PA-C  atorvastatin  (LIPITOR ) 80 MG tablet Take 1 tablet (80 mg total) by mouth daily. 07/01/24  Yes Fleming, Zelda W, NP  clopidogrel  (PLAVIX ) 75 MG tablet Take 1 tablet (75 mg total) by mouth daily with breakfast. 06/02/24  Yes Fleming, Zelda W, NP  metoprolol  tartrate (LOPRESSOR ) 25 MG tablet Take 1 tablet (25 mg total) by mouth 2 (two) times daily. 07/01/24  Yes Theotis Haze ORN, NP    Allergies: Patient has no known allergies.    Review of Systems  Cardiovascular:  Positive for chest pain.    Updated Vital Signs BP (!) 139/93   Pulse 64   Temp 97.9 F (36.6 C) (Oral)   Resp 18   Ht 6' 1 (1.854 m)   Wt 81.2 kg   SpO2 100%   BMI 23.62 kg/m   Physical Exam Vitals and nursing note reviewed.  Constitutional:      General: He is not in acute distress.    Appearance: He is well-developed.  HENT:     Head: Normocephalic and atraumatic.  Eyes:     Conjunctiva/sclera: Conjunctivae  normal.  Cardiovascular:     Rate and Rhythm: Normal rate and regular rhythm.  Pulmonary:     Effort: Pulmonary effort is normal. No respiratory distress.     Breath sounds: Normal breath sounds.  Chest:     Chest wall: No tenderness.  Abdominal:     Palpations: Abdomen is soft.     Tenderness: There is no abdominal tenderness.  Musculoskeletal:        General: No swelling.     Cervical back: Neck supple.     Right lower leg: No edema.     Left lower leg: No edema.  Skin:    General: Skin is warm and dry.     Capillary Refill: Capillary refill takes less than 2 seconds.  Neurological:     Mental Status: He is alert.  Psychiatric:        Mood and Affect: Mood normal.     (all labs ordered are listed, but only abnormal results are displayed) Labs Reviewed  CBC - Abnormal; Notable for the following components:      Result Value   RBC 4.13 (*)    MCV 101.5 (*)    MCH 35.4 (*)    All other components within normal limits  BASIC METABOLIC PANEL WITH GFR  TROPONIN T,  HIGH SENSITIVITY  TROPONIN T, HIGH SENSITIVITY    EKG: EKG Interpretation Date/Time:  Friday August 08 2024 00:10:40 EST Ventricular Rate:  67 PR Interval:  146 QRS Duration:  92 QT Interval:  386 QTC Calculation: 407 R Axis:   91  Text Interpretation: Normal sinus rhythm Rightward axis Borderline ECG When compared with ECG of 29-Jul-2023 01:58, PREVIOUS ECG IS PRESENT Confirmed by Lorette Mayo [phone]) on 08/08/2024 2:02:30 AM  Radiology: [facility] Chest Port 1 View Result Date: 08/08/2024 EXAM: 1 VIEW(S) XRAY OF THE CHEST 08/08/2024 12:48:00 AM COMPARISON: 07/29/2023 CLINICAL HISTORY: Chest pain FINDINGS: LUNGS AND PLEURA: No focal pulmonary opacity. No pleural effusion. No pneumothorax. HEART AND MEDIASTINUM: Coronary stenting noted. No acute abnormality of the cardiac and mediastinal silhouettes. BONES AND SOFT TISSUES: No acute osseous abnormality. IMPRESSION: 1. No acute cardiopulmonary pathology. 2. Coronary  stenting noted. Electronically signed by: Greig Pique MD 08/08/2024 01:03 AM EST RP Workstation: [id number]     Procedures   Medications Ordered in the ED - No data to display                                  Medical Decision Making Amount and/or Complexity of Data Reviewed Labs: ordered. Radiology: ordered.   This patient presents to the ED for concern of chest pain, this involves an extensive number of treatment options, and is a complaint that carries with it a high risk of complications and morbidity.  The differential diagnosis includes ACS, pneumonia, angina, PE, dissection, GERD, others   Co morbidities / Chronic conditions that complicate the patient evaluation  As noted in HPI   Additional history obtained:  Additional history obtained from EMR External records from outside source obtained and reviewed including internal medicine notes   Lab Tests:  I Ordered, and personally interpreted labs.  The pertinent results include: Initial and repeat troponin is less than 15   Imaging Studies ordered:  I ordered imaging studies including chest x-ray I independently visualized and interpreted imaging which showed no acute findings I agree with the radiologist interpretation   Cardiac Monitoring: / EKG:  The patient was maintained on a cardiac monitor.  I personally viewed and interpreted the cardiac monitored which showed an underlying rhythm of: Sinus rhythm   Social Determinants of Health:  Patient is an occasional smoker, has Medicaid for his primary health insurance type   Test / Admission - Considered:  Patient with reassuring workup with negative troponins x 2 and a nonischemic EKG.  He is currently asymptomatic.  No sign of ACS at this time.  He is not short of breath.  No suspicion at this time a pulmonary embolism.  Symptoms are not consistent with dissection.  Symptoms are not consistent with GERD.  Plan to discharge patient and provide referral to  cardiology for outpatient evaluation.  Patient voices understanding with plan.  Return precautions provided.      Final diagnoses:  Chest pain, unspecified type    ED Discharge Orders          Ordered    Ambulatory referral to Cardiology       Comments: If you have not heard from the Cardiology office within the next 72 hours please call [phone].   08/08/24 0550               Logan Ubaldo NOVAK, PA-C 08/08/24 0550    Midge Golas, MD 08/08/24 [phone]  "

## 2024-08-08 NOTE — Discharge Instructions (Signed)
 Your workup this morning was reassuring.  If you have worsening chest pain or shortness of breath please return to the emergency department.  A referral has been placed with cardiology for further evaluation.  They should contact you to schedule an appointment.

## 2024-08-08 NOTE — ED Triage Notes (Signed)
 Patient reports chest pain and pressure. Denies shortness of breath, nausea or vomiting. Reports pain in the right arm. States that pain started a couple of hours ago. Patient has not taken any medication for the pain. Pain is rated 3/10 (8/10 in the beginning). History of a stent.

## 2024-08-08 NOTE — ED Provider Triage Note (Signed)
 Emergency Medicine Provider Triage Evaluation Note  Paul Molina , a 49 y.o. male  was evaluated in triage.  Pt complains of chest pressure.  Pain began a few hours prior to arrival with no associated shortness of breath, nausea, or vomiting.  Patient is taking nothing for the pain.  He states his pressure was initially 8 out of 10 in severity but is currently 3 out of 10 in severity.  He does have history of left heart cath with stenting in November 2024.  Review of Systems  Positive:  Negative:   Physical Exam  BP (!) 138/96 (BP Location: Right Arm)   Pulse 69   Temp 98.6 F (37 C) (Oral)   Resp 18   SpO2 100%  Gen:   Awake, no distress   Resp:  Normal effort  MSK:   Moves extremities without difficulty  Other:    Medical Decision Making  Medically screening exam initiated at 12:34 AM.  Appropriate orders placed.  Paul Molina was informed that the remainder of the evaluation will be completed by another provider, this initial triage assessment does not replace that evaluation, and the importance of remaining in the ED until their evaluation is complete.     Logan Ubaldo NOVAK, [place] 08/08/24 [phone]

## 2024-08-25 ENCOUNTER — Ambulatory Visit: Admitting: Nurse Practitioner

## 2024-08-25 ENCOUNTER — Other Ambulatory Visit: Payer: Self-pay

## 2024-09-02 ENCOUNTER — Other Ambulatory Visit: Payer: Self-pay | Admitting: Nurse Practitioner

## 2024-09-02 ENCOUNTER — Other Ambulatory Visit: Payer: Self-pay

## 2024-09-02 DIAGNOSIS — I25110 Atherosclerotic heart disease of native coronary artery with unstable angina pectoris: Secondary | ICD-10-CM

## 2024-09-02 MED ORDER — CLOPIDOGREL BISULFATE 75 MG PO TABS
75.0000 mg | ORAL_TABLET | Freq: Every day | ORAL | 1 refills | Status: AC
  Filled 2024-09-02: qty 30, 30d supply, fill #0
